# Patient Record
Sex: Male | Born: 1965 | Race: Black or African American | Hispanic: No | Marital: Married | Smoking: Never smoker
Health system: Southern US, Community
[De-identification: ages and names within clinical notes are randomized; demographics above are authoritative.]

## PROBLEM LIST (undated history)

## (undated) DIAGNOSIS — I10 Essential (primary) hypertension: Secondary | ICD-10-CM

## (undated) HISTORY — PX: FRACTURE SURGERY: SHX138

---

## 2019-07-18 ENCOUNTER — Ambulatory Visit
Admission: EM | Admit: 2019-07-18 | Discharge: 2019-07-18 | Disposition: A | Discharge status: Home / Self Care | Attending: Family Medicine | Admitting: Family Medicine

## 2019-07-18 ENCOUNTER — Encounter: Payer: Self-pay | Admitting: Emergency Medicine

## 2019-07-18 ENCOUNTER — Other Ambulatory Visit: Payer: Self-pay

## 2019-07-18 DIAGNOSIS — Z7189 Other specified counseling: Secondary | ICD-10-CM | POA: Diagnosis present

## 2019-07-18 DIAGNOSIS — U071 COVID-19: Secondary | ICD-10-CM | POA: Diagnosis not present

## 2019-07-18 DIAGNOSIS — Z20822 Contact with and (suspected) exposure to covid-19: Secondary | ICD-10-CM

## 2019-07-18 NOTE — ED Provider Notes (Signed)
[facility] ____________________________________________  Time seen: Approximately 12:36 PM  I have reviewed the triage vital signs and the nursing notes.   HISTORY  Chief Complaint COVID testing   HPI Scott [name] is a 54 y.o. male presenting for COVID-19 testing.  Patient reports he feels well, reports his wife is currently sick with COVID-19 as of last Wednesday.  Denies cough, congestion, sore throat, chest pain or shortness of breath, vomiting, diarrhea, change in taste or smell.  States feels well.  Denies aggravating or relieving factors.  History reviewed. No pertinent past medical history.  There are no problems to display for this patient.   History reviewed. No pertinent surgical history.   No current facility-administered medications for this encounter. No current outpatient medications on file.  Allergies Patient has no known allergies.  History reviewed. No pertinent family history.  Social History Social History   Tobacco Use  . Smoking status: Never Smoker  . Smokeless tobacco: Never Used  Substance Use Topics  . Alcohol use: Never  . Drug use: Never    Review of Systems Constitutional: No fever ENT: No sore throat. Cardiovascular: Denies chest pain. Respiratory: Denies shortness of breath. Gastrointestinal: No abdominal pain.  No nausea, no vomiting.  No diarrhea.  Musculoskeletal: Negative for back pain. Skin: Negative for rash.   ____________________________________________   PHYSICAL EXAM:  VITAL SIGNS: ED Triage Vitals  Enc Vitals Group     BP 07/18/19 1155 122/81     Pulse Rate 07/18/19 1155 78     Resp 07/18/19 1155 18     Temp 07/18/19 1155 100.2 F (37.9 C)     Temp Source 07/18/19 1155 Oral     SpO2 07/18/19 1155 100 %     Weight 07/18/19 1152 175 lb (79.4 kg)     Height 07/18/19 1152 5\' 8"  (1.727 m)     Head Circumference --      Peak Flow --      Pain Score 07/18/19 1152 0     Pain Loc --      Pain  Edu? --      Excl. in [place]? --     Constitutional: Alert and oriented. Well appearing and in no acute distress. Eyes: Conjunctivae are normal.  ENT      Head: Normocephalic and atraumatic. Cardiovascular: Normal rate, regular rhythm. Grossly normal heart sounds.  Good peripheral circulation. Respiratory: Normal respiratory effort without tachypnea nor retractions. Breath sounds are clear and equal bilaterally. No wheezes, rales, rhonchi. Musculoskeletal:  Steady gait.  Neurologic:  Normal speech and language. Speech is normal. No gait instability.  Skin:  Skin is warm, dry and intact. No rash noted. Psychiatric: Mood and affect are normal. Speech and behavior are normal. Patient exhibits appropriate insight and judgment   ___________________________________________   LABS (all labs ordered are listed, but only abnormal results are displayed)  Labs Reviewed  NOVEL CORONAVIRUS, NAA (HOSP ORDER, SEND-OUT TO REF LAB; TAT 18-24 HRS)   ____________________________________________   PROCEDURES Procedures    INITIAL IMPRESSION / ASSESSMENT AND PLAN / ED COURSE  Pertinent labs & imaging results that were available during my care of the patient were reviewed by me and considered in my medical decision making (see chart for details).  Well-appearing patient.  No acute distress.  COVID-19 testing completed and advice given.  Patient denies symptoms, however noted mild temperature elevation in urgent care, counseled to monitor.  Encouraged to remain home until testing result received and monitoring for symptoms.  Work note given.  Discussed follow up and return parameters including no resolution or any worsening concerns. Patient verbalized understanding and agreed to plan.   ____________________________________________   FINAL CLINICAL IMPRESSION(S) / ED DIAGNOSES  Final diagnoses:  Encounter for screening laboratory testing for COVID-19 virus  Advice given about COVID-19 virus  infection     ED Discharge Orders    None       Note: This dictation was prepared with Dragon dictation along with smaller phrase technology. Any transcriptional errors that result from this process are unintentional.         Renford Dills, NP 07/18/19 1238

## 2019-07-18 NOTE — ED Triage Notes (Signed)
Patient here for COVID testing. Exposed last week. No symptoms.

## 2019-07-18 NOTE — Discharge Instructions (Signed)
Monitor. Drink plenty of fluids.   Follow up with your primary care physician this week as needed. Return to Urgent care for new or worsening concerns.

## 2019-07-19 ENCOUNTER — Telehealth (HOSPITAL_COMMUNITY): Payer: Self-pay | Admitting: Emergency Medicine

## 2019-07-19 LAB — NOVEL CORONAVIRUS, NAA (HOSP ORDER, SEND-OUT TO REF LAB; TAT 18-24 HRS): SARS-CoV-2, NAA: DETECTED — AB

## 2019-07-19 NOTE — Telephone Encounter (Signed)

## 2019-10-05 ENCOUNTER — Encounter: Payer: Self-pay | Admitting: Emergency Medicine

## 2019-10-05 ENCOUNTER — Ambulatory Visit
Admission: EM | Admit: 2019-10-05 | Discharge: 2019-10-05 | Disposition: A | Discharge status: Home / Self Care | Attending: Family Medicine | Admitting: Family Medicine

## 2019-10-05 ENCOUNTER — Other Ambulatory Visit: Payer: Self-pay

## 2019-10-05 DIAGNOSIS — R001 Bradycardia, unspecified: Secondary | ICD-10-CM | POA: Diagnosis present

## 2019-10-05 DIAGNOSIS — M545 Low back pain: Secondary | ICD-10-CM | POA: Diagnosis present

## 2019-10-05 DIAGNOSIS — G8929 Other chronic pain: Secondary | ICD-10-CM | POA: Diagnosis present

## 2019-10-05 DIAGNOSIS — I440 Atrioventricular block, first degree: Secondary | ICD-10-CM

## 2019-10-05 LAB — BASIC METABOLIC PANEL
Anion gap: 8 (ref 5–15)
BUN: 20 mg/dL (ref 6–20)
CO2: 25 mmol/L (ref 22–32)
Calcium: 9.2 mg/dL (ref 8.9–10.3)
Chloride: 107 mmol/L (ref 98–111)
Creatinine, Ser: 0.93 mg/dL (ref 0.61–1.24)
GFR calc Af Amer: >60 mL/min (ref >60)
GFR calc non Af Amer: >60 mL/min (ref >60)
Glucose, Bld: 106 mg/dL — ABNORMAL HIGH (ref 70–99)
Potassium: 3.7 mmol/L (ref 3.5–5.1)
Sodium: 140 mmol/L (ref 135–145)

## 2019-10-05 LAB — MAGNESIUM: Magnesium: 2.2 mg/dL (ref 1.7–2.4)

## 2019-10-05 NOTE — Discharge Instructions (Addendum)
Establish care and follow up with Primary Care provider for further evaluation and possible referral

## 2019-10-05 NOTE — ED Triage Notes (Signed)
Patient in today c/o palpitations off & on x 2 weeks. Patient denies sob. Patient also c/o back pain that is a chronic issue for years. Patient use to get steroid injections in his back.

## 2019-10-06 LAB — TSH: TSH: 0.837 u[IU]/mL (ref 0.350–4.500)

## 2019-10-17 NOTE — ED Provider Notes (Addendum)
[facility]    CSN: [id number] Arrival date & time: 10/05/19  1725      History   Chief Complaint Chief Complaint  Patient presents with  . Palpitations  . Back Pain    HPI Scott [name] is a 54 y.o. male.   54 yo male with a c/o palpitations on and off for 2 weeks. States feels like heart "skipping a beat" and lasts "a few seconds". Denies any chest pains, shortness of breath, fevers, chills, cough, syncope, PND, orthopnea, leg swelling. States he exercises a couple times a week and does not have any problems with chest pains or shortness of breath.   Patient also c/o chronic low back pains intermittently for years. Usually controlled with otc medications. States unsure if this is related to the palpitations and that he has received steroid shots in his back in the past but none recently.    Palpitations Associated symptoms: back pain   Back Pain   History reviewed. No pertinent past medical history.  There are no problems to display for this patient.   Past Surgical History:  Procedure Laterality Date  . FRACTURE SURGERY Right    arm       Home Medications    Prior to Admission medications   Not on File    Family History Family History  Problem Relation Age of Onset  . Cervical cancer Mother   . Prostate cancer Father   . Hypertension Father     Social History Social History   Tobacco Use  . Smoking status: Never Smoker  . Smokeless tobacco: Never Used  Substance Use Topics  . Alcohol use: Never  . Drug use: Never     Allergies   Patient has no known allergies.   Review of Systems Review of Systems  Cardiovascular: Positive for palpitations.  Musculoskeletal: Positive for back pain.     Physical Exam Triage Vital Signs ED Triage Vitals  Enc Vitals Group     BP 10/05/19 1736 (!) 141/96     Pulse Rate 10/05/19 1736 62     Resp 10/05/19 1736 18     Temp 10/05/19 1736 98.2 F (36.8 C)     Temp Source 10/05/19 1736  Oral     SpO2 10/05/19 1736 99 %     Weight 10/05/19 1737 175 lb (79.4 kg)     Height 10/05/19 1737 5\' 8"  (1.727 m)     Head Circumference --      Peak Flow --      Pain Score 10/05/19 1736 7     Pain Loc --      Pain Edu? --      Excl. in [place]? --    No data found.  Updated Vital Signs BP (!) 141/96 (BP Location: Left Arm)   Pulse 62   Temp 98.2 F (36.8 C) (Oral)   Resp 18   Ht 5\' 8"  (1.727 m)   Wt 79.4 kg   SpO2 99%   BMI 26.61 kg/m   Visual Acuity Right Eye Distance:   Left Eye Distance:   Bilateral Distance:    Right Eye Near:   Left Eye Near:    Bilateral Near:     Physical Exam Vitals reviewed.  Constitutional:      General: He is not in acute distress.    Appearance: He is not toxic-appearing or diaphoretic.  Cardiovascular:     Rate and Rhythm: Normal rate and regular rhythm.  Heart sounds: Normal heart sounds.  Pulmonary:     Effort: Pulmonary effort is normal. No respiratory distress.     Breath sounds: Normal breath sounds. No stridor. No wheezing, rhonchi or rales.  Neurological:     Mental Status: He is alert.      UC Treatments / Results  Labs (all labs ordered are listed, but only abnormal results are displayed) Labs Reviewed  BASIC METABOLIC PANEL - Abnormal; Notable for the following components:      Result Value   Glucose, Bld 106 (*)    All other components within normal limits  MAGNESIUM  TSH    EKG   Radiology No results found.  Procedures ED EKG  Date/Time: 10/17/2019 2:07 PM Performed by: Payton Mccallum, MD Authorized by: Payton Mccallum, MD   ECG reviewed by ED Physician in the absence of a cardiologist: yes   Previous ECG:    Previous ECG:  Unavailable Interpretation:    Interpretation: abnormal   Rate:    ECG rate:  59   ECG rate assessment: bradycardic   Rhythm:    Rhythm: sinus bradycardia   Ectopy:    Ectopy: aberrant   QRS:    QRS axis:  Normal   QRS intervals:  Normal Conduction:    Conduction:  abnormal     Abnormal conduction: 1st degree   ST segments:    ST segments:  Normal T waves:    T waves: normal     (including critical care time)  Medications Ordered in UC Medications - No data to display  Initial Impression / Assessment and Plan / UC Course  I have reviewed the triage vital signs and the nursing notes.  Pertinent labs & imaging results that were available during my care of the patient were reviewed by me and considered in my medical decision making (see chart for details).      Final Clinical Impressions(s) / UC Diagnoses   Final diagnoses:  Sinus bradycardia  1st degree AV block  Chronic left-sided low back pain without sciatica     Discharge Instructions     Establish care and follow up with Primary Care provider for further evaluation and possible referral     ED Prescriptions    None      1. ekg, lab results and diagnosis reviewed with patient 2. Recommend patient follow up with PCP for further evaluation (ie. Possible Holter monitor and/or referral to cardiologist) 3. TSH pending 4. Follow-up prn if symptoms worsen or don't improve   PDMP not reviewed this encounter.   Payton Mccallum, MD 10/17/19 1405    Payton Mccallum, MD 10/17/19 1407

## 2019-10-19 ENCOUNTER — Telehealth: Payer: Self-pay

## 2019-10-19 NOTE — Telephone Encounter (Signed)
Called lmom informing patient of appointment on 10/23/2019. klh 

## 2019-10-23 ENCOUNTER — Other Ambulatory Visit: Payer: Self-pay

## 2019-10-23 ENCOUNTER — Encounter: Payer: Self-pay | Admitting: Adult Health

## 2019-10-23 ENCOUNTER — Ambulatory Visit (INDEPENDENT_AMBULATORY_CARE_PROVIDER_SITE_OTHER): Admitting: Adult Health

## 2019-10-23 ENCOUNTER — Ambulatory Visit
Admission: RE | Admit: 2019-10-23 | Discharge: 2019-10-23 | Disposition: A | Discharge status: Home / Self Care | Attending: Adult Health | Admitting: Adult Health

## 2019-10-23 ENCOUNTER — Ambulatory Visit
Admission: RE | Admit: 2019-10-23 | Discharge: 2019-10-23 | Disposition: A | Discharge status: Home / Self Care | Source: Ambulatory Visit | Attending: Adult Health | Admitting: Adult Health

## 2019-10-23 VITALS — BP 152/87 | HR 60 | Temp 97.4°F | Resp 16 | Ht 68.0 in | Wt 182.4 lb

## 2019-10-23 DIAGNOSIS — Z8616 Personal history of COVID-19: Secondary | ICD-10-CM

## 2019-10-23 DIAGNOSIS — R400 Somnolence: Secondary | ICD-10-CM | POA: Diagnosis not present

## 2019-10-23 DIAGNOSIS — M5126 Other intervertebral disc displacement, lumbar region: Secondary | ICD-10-CM | POA: Diagnosis not present

## 2019-10-23 DIAGNOSIS — R05 Cough: Secondary | ICD-10-CM | POA: Diagnosis not present

## 2019-10-23 DIAGNOSIS — R059 Cough, unspecified: Secondary | ICD-10-CM

## 2019-10-23 DIAGNOSIS — R0683 Snoring: Secondary | ICD-10-CM | POA: Diagnosis not present

## 2019-10-23 NOTE — Progress Notes (Signed)
[name] [address] [place], [place] [address]  Internal MEDICINE  Office Visit Note  Patient Name: Scott [name]  [id number]  [id number]  Date of Service: 10/31/2019   Complaints/HPI Pt is here for establishment of PCP. Chief Complaint  Patient presents with  . New Patient (Initial Visit)   HPI  Pt is here to establish care.  He is a well appearing 54 yo male.  He has a history of lower back herniated disc, with left leg pain. He is requesting an ortho referral at this time.  He recently had an episode of heart palpitations and was seen in urgent care.  They were unable to locate a cause. He also Had inconclusive sleep study (fall 2020) and they recommended he see a specialist. Covid outbreak happened and he never followed up. He also complains of ongoing cough.  He did have Covid-19, and has not been able to stop coughing.  It is intermittent.      Current Medication: No outpatient encounter medications on file as of 10/23/2019.   No facility-administered encounter medications on file as of 10/23/2019.    Surgical History: Past Surgical History:  Procedure Laterality Date  . FRACTURE SURGERY Right    arm    Medical History: History reviewed. No pertinent past medical history.  Family History: Family History  Problem Relation Age of Onset  . Cervical cancer Mother   . Prostate cancer Father   . Hypertension Father     Social History   Socioeconomic History  . Marital status: Married    Spouse name: Not on file  . Number of children: Not on file  . Years of education: Not on file  . Highest education level: Not on file  Occupational History  . Not on file  Tobacco Use  . Smoking status: Never Smoker  . Smokeless tobacco: Never Used  Substance and Sexual Activity  . Alcohol use: Never  . Drug use: Never  . Sexual activity: Not on file  Other Topics Concern  . Not on file  Social History Narrative  . Not on file   Social Determinants of  Health   Financial Resource Strain:   . Difficulty of Paying Living Expenses:   Food Insecurity:   . Worried About Programme researcher, broadcasting/film/video in the Last Year:   . Barista in the Last Year:   Transportation Needs:   . Freight forwarder (Medical):   Marland Kitchen Lack of Transportation (Non-Medical):   Physical Activity:   . Days of Exercise per Week:   . Minutes of Exercise per Session:   Stress:   . Feeling of Stress :   Social Connections:   . Frequency of Communication with Friends and Family:   . Frequency of Social Gatherings with Friends and Family:   . Attends Religious Services:   . Active Member of Clubs or Organizations:   . Attends Banker Meetings:   Marland Kitchen Marital Status:   Intimate Partner Violence:   . Fear of Current or Ex-Partner:   . Emotionally Abused:   Marland Kitchen Physically Abused:   . Sexually Abused:      Review of Systems  Constitutional: Negative.  Negative for chills, fatigue and unexpected weight change.  HENT: Negative.  Negative for congestion, rhinorrhea, sneezing and sore throat.   Eyes: Negative for redness.  Respiratory: Negative.  Negative for cough, chest tightness and shortness of breath.   Cardiovascular: Negative.  Negative for chest pain and palpitations.  Gastrointestinal: Negative.  Negative for abdominal pain, constipation, diarrhea, nausea and vomiting.  Endocrine: Negative.   Genitourinary: Negative.  Negative for dysuria and frequency.  Musculoskeletal: Negative.  Negative for arthralgias, back pain, joint swelling and neck pain.  Skin: Negative.  Negative for rash.  Allergic/Immunologic: Negative.   Neurological: Negative.  Negative for tremors and numbness.  Hematological: Negative for adenopathy. Does not bruise/bleed easily.  Psychiatric/Behavioral: Negative.  Negative for behavioral problems, sleep disturbance and suicidal ideas. The patient is not nervous/anxious.     Vital Signs: BP (!) 152/87   Pulse 60   Temp (!) 97.4 F  (36.3 C)   Resp 16   Ht 5\' 8"  (1.727 m)   Wt 182 lb 6.4 oz (82.7 kg)   SpO2 96%   BMI 27.73 kg/m    Physical Exam Vitals and nursing note reviewed.  Constitutional:      General: He is not in acute distress.    Appearance: He is well-developed. He is not diaphoretic.  HENT:     Head: Normocephalic and atraumatic.     Mouth/Throat:     Pharynx: No oropharyngeal exudate.  Eyes:     Pupils: Pupils are equal, round, and reactive to light.  Neck:     Thyroid: No thyromegaly.     Vascular: No JVD.     Trachea: No tracheal deviation.  Cardiovascular:     Rate and Rhythm: Normal rate and regular rhythm.     Heart sounds: Normal heart sounds. No murmur. No friction rub. No gallop.   Pulmonary:     Effort: Pulmonary effort is normal. No respiratory distress.     Breath sounds: Normal breath sounds. No wheezing or rales.  Chest:     Chest wall: No tenderness.  Abdominal:     Palpations: Abdomen is soft.     Tenderness: There is no abdominal tenderness. There is no guarding.  Musculoskeletal:        General: Normal range of motion.     Cervical back: Normal range of motion and neck supple.  Lymphadenopathy:     Cervical: No cervical adenopathy.  Skin:    General: Skin is warm and dry.  Neurological:     Mental Status: He is alert and oriented to person, place, and time.     Cranial Nerves: No cranial nerve deficit.  Psychiatric:        Behavior: Behavior normal.        Thought Content: Thought content normal.        Judgment: Judgment normal.    Assessment/Plan: 1. Herniated lumbar disc without myelopathy Referral per patient request.  - Ambulatory referral to Orthopedic Surgery  2. Cough Have PFT and CXR done before next visit.  - Pulmonary Function Test; Future - DG Chest 2 View; Future  3. Loud snoring Discussed repeating sleep study, he would like to get his back taken care of first.   4. Daytime sleepiness Needs sleep study, see above.   5. History of  COVID-19 Recovered, lingering cough.   General Counseling: hershell [name] understanding of the findings of todays visit and agrees with plan of treatment. I have discussed any further diagnostic evaluation that may be needed or ordered today. We also reviewed his medications today. he has been encouraged to call the office with any questions or concerns that should arise related to todays visit.  Orders Placed This Encounter  Procedures  . DG Chest 2 View  . Ambulatory referral to Orthopedic Surgery  .  Pulmonary Function Test    No orders of the defined types were placed in this encounter.   Time spent: 30 Minutes   This patient was seen by Blima Ledger AGNP-C in Collaboration with Dr Lyndon Code as a part of collaborative care agreement  Johnna Acosta AGNP-C Internal Medicine

## 2019-11-05 ENCOUNTER — Telehealth: Payer: Self-pay

## 2019-11-05 NOTE — Telephone Encounter (Signed)
Confirmed appointment on 11/07/2019 and screened for covid. klh °

## 2019-11-07 ENCOUNTER — Other Ambulatory Visit: Payer: Self-pay

## 2019-11-07 ENCOUNTER — Ambulatory Visit (INDEPENDENT_AMBULATORY_CARE_PROVIDER_SITE_OTHER): Admitting: Internal Medicine

## 2019-11-07 DIAGNOSIS — R0602 Shortness of breath: Secondary | ICD-10-CM

## 2019-11-07 LAB — PULMONARY FUNCTION TEST

## 2019-11-09 ENCOUNTER — Other Ambulatory Visit: Payer: Self-pay | Admitting: Orthopedic Surgery

## 2019-11-09 DIAGNOSIS — Z8739 Personal history of other diseases of the musculoskeletal system and connective tissue: Secondary | ICD-10-CM

## 2019-11-09 DIAGNOSIS — M5442 Lumbago with sciatica, left side: Secondary | ICD-10-CM

## 2019-11-09 DIAGNOSIS — M545 Low back pain, unspecified: Secondary | ICD-10-CM

## 2019-11-15 NOTE — Procedures (Signed)
[facility] [address] [place], [address]  DATE OF SERVICE: Nov 07, 2019  Complete Pulmonary Function Testing Interpretation:  FINDINGS:  Forced vital capacity is normal.  The FEV1 is normal.  Postbronchodilator no significant change in FEV1.  FEV1 FVC ratio is normal.  Total lung capacity is decreased mildly decreased.  Residual volume is decreased residual in total lung capacity ratio is decreased FRC is decreased.  DLCO is mildly decreased.  DLCO corrected for alveolar volume is normal.  IMPRESSION:  This pulmonary function study is suggestive of mild restrictive lung disease.  There is no response to bronchodilators clinical correlation recommended.  Yevonne Pax, MD [facility] Pulmonary Critical Care Medicine Sleep Medicine

## 2019-11-19 ENCOUNTER — Telehealth: Payer: Self-pay

## 2019-11-19 NOTE — Telephone Encounter (Signed)
Confirmed appointment on 11/21/2019 and screened for covid. klh 

## 2019-11-21 ENCOUNTER — Ambulatory Visit: Admission: RE | Admit: 2019-11-21 | Source: Ambulatory Visit

## 2019-11-21 ENCOUNTER — Encounter: Payer: Self-pay | Admitting: Adult Health

## 2019-11-21 ENCOUNTER — Other Ambulatory Visit: Payer: Self-pay

## 2019-11-21 ENCOUNTER — Ambulatory Visit (INDEPENDENT_AMBULATORY_CARE_PROVIDER_SITE_OTHER): Admitting: Adult Health

## 2019-11-21 VITALS — BP 149/96 | HR 59 | Temp 97.4°F | Resp 16 | Ht 68.0 in | Wt 179.8 lb

## 2019-11-21 DIAGNOSIS — R61 Generalized hyperhidrosis: Secondary | ICD-10-CM | POA: Diagnosis not present

## 2019-11-21 DIAGNOSIS — Z0001 Encounter for general adult medical examination with abnormal findings: Secondary | ICD-10-CM | POA: Diagnosis not present

## 2019-11-21 DIAGNOSIS — R300 Dysuria: Secondary | ICD-10-CM

## 2019-11-21 DIAGNOSIS — R059 Cough, unspecified: Secondary | ICD-10-CM

## 2019-11-21 DIAGNOSIS — Z125 Encounter for screening for malignant neoplasm of prostate: Secondary | ICD-10-CM

## 2019-11-21 DIAGNOSIS — F4310 Post-traumatic stress disorder, unspecified: Secondary | ICD-10-CM

## 2019-11-21 DIAGNOSIS — N529 Male erectile dysfunction, unspecified: Secondary | ICD-10-CM

## 2019-11-21 DIAGNOSIS — R05 Cough: Secondary | ICD-10-CM

## 2019-11-21 DIAGNOSIS — R0683 Snoring: Secondary | ICD-10-CM

## 2019-11-21 MED ORDER — SILDENAFIL CITRATE 20 MG PO TABS: 20.0000 mg | ORAL_TABLET | Freq: Every day | ORAL | 0 refills | Status: DC | PRN

## 2019-11-21 NOTE — Progress Notes (Signed)
[facility] [place], [place] [address]  Internal MEDICINE  Office Visit Note  Patient Name: Scott [name]  [id number]  [id number]  Date of Service: 12/08/2019  Chief Complaint  Patient presents with  . Annual Exam    review pft  . Back Pain    lower left side of back  . Cough    on and off cough since jan.     HPI Pt is here for routine health maintenance examination and to review pft. He is a well appearing 54 yo AA male.  He has a history of PTSD and ED.  He currently takes Sildenafil as his only RX medication.  He denies ever being on other medications. He considers his PTSD mild, he described nightmares about twice a week.  He does not see a counselor, and after discussed he would like to do so at this time.  He also reports some night sweats that have become more frequently.  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  He denies any hemoptysis but does report some coughing. He also reports his wife is complaining about his loud snoring, he does cough some at night when waking up.        Current Medication: Outpatient Encounter Medications as of 11/21/2019  Medication Sig  . sildenafil (REVATIO) 20 MG tablet Take 1 tablet (20 mg total) by mouth daily as needed.   No facility-administered encounter medications on file as of 11/21/2019.    Surgical History: Past Surgical History:  Procedure Laterality Date  . FRACTURE SURGERY Right    arm    Medical History: History reviewed. No pertinent past medical history.  Family History: Family History  Problem Relation Age of Onset  . Cervical cancer Mother   . Prostate cancer Father   . Hypertension Father       Review of Systems  Constitutional: Negative.  Negative for chills, fatigue and unexpected weight change.  HENT: Negative.  Negative for congestion, rhinorrhea, sneezing and sore throat.   Eyes: Negative for redness.   Respiratory: Positive for cough. Negative for chest tightness and shortness of breath.   Cardiovascular: Negative.  Negative for chest pain and palpitations.  Gastrointestinal: Negative.  Negative for abdominal pain, constipation, diarrhea, nausea and vomiting.  Endocrine: Negative.   Genitourinary: Negative.  Negative for dysuria and frequency.  Musculoskeletal: Negative.  Negative for arthralgias, back pain, joint swelling and neck pain.  Skin: Negative.  Negative for rash.       Night sweats  Allergic/Immunologic: Negative.   Neurological: Negative.  Negative for tremors and numbness.  Hematological: Negative for adenopathy. Does not bruise/bleed easily.  Psychiatric/Behavioral: Negative.  Negative for behavioral problems, sleep disturbance and suicidal ideas. The patient is not nervous/anxious.      Vital Signs: BP (!) 149/96   Pulse (!) 59   Temp (!) 97.4 F (36.3 C)   Resp 16   Ht 5\' 8"  (1.727 m)   Wt 179 lb 12.8 oz (81.6 kg)   SpO2 95%   BMI 27.34 kg/m    Physical Exam Vitals and nursing note reviewed.  Constitutional:      General: He is not in acute distress.    Appearance: He is well-developed. He is not diaphoretic.  HENT:     Head: Normocephalic and atraumatic.     Mouth/Throat:     Pharynx: No oropharyngeal exudate.  Eyes:     Pupils: Pupils  are equal, round, and reactive to light.  Neck:     Thyroid: No thyromegaly.     Vascular: No JVD.     Trachea: No tracheal deviation.  Cardiovascular:     Rate and Rhythm: Normal rate and regular rhythm.     Heart sounds: Normal heart sounds. No murmur heard.  No friction rub. No gallop.   Pulmonary:     Effort: Pulmonary effort is normal. No respiratory distress.     Breath sounds: Normal breath sounds. No wheezing or rales.  Chest:     Chest wall: No tenderness.  Abdominal:     Palpations: Abdomen is soft.  Musculoskeletal:        General: Normal range of motion.     Cervical back: Normal range of motion  and neck supple.  Lymphadenopathy:     Cervical: No cervical adenopathy.  Skin:    General: Skin is warm and dry.  Neurological:     Mental Status: He is alert and oriented to person, place, and time.     Cranial Nerves: No cranial nerve deficit.  Psychiatric:        Behavior: Behavior normal.        Thought Content: Thought content normal.        Judgment: Judgment normal.      LABS: Recent Results (from the past 2160 hour(s))  Basic metabolic panel     Status: Abnormal   Collection Time: 10/05/19  6:08 PM  Result Value Ref Range   Sodium 140 135 - 145 mmol/L   Potassium 3.7 3.5 - 5.1 mmol/L   Chloride 107 98 - 111 mmol/L   CO2 25 22 - 32 mmol/L   Glucose, Bld 106 (H) 70 - 99 mg/dL    Comment: Glucose reference range applies only to samples taken after fasting for at least 8 hours.   BUN 20 6 - 20 mg/dL   Creatinine, Ser 8.29 0.61 - 1.24 mg/dL   Calcium 9.2 8.9 - 56.2 mg/dL   GFR calc non Af Amer >60 >60 mL/min   GFR calc Af Amer >60 >60 mL/min   Anion gap 8 5 - 15    Comment: Performed at [facility], [address] [place], [place] [address]  Magnesium     Status: None   Collection Time: 10/05/19  6:08 PM  Result Value Ref Range   Magnesium 2.2 1.7 - 2.4 mg/dL    Comment: Performed at [facility], [address], [place], [place] [address]  TSH     Status: None   Collection Time: 10/05/19  6:08 PM  Result Value Ref Range   TSH 0.837 0.350 - 4.500 uIU/mL    Comment: Performed by a 3rd Generation assay with a functional sensitivity of <=0.01 uIU/mL. Performed at [facility], [address], [place], [place] [address]   Pulmonary Function Test     Status: None   Collection Time: 11/07/19 11:30 AM  Result Value Ref Range   FEV1     FVC     FEV1/FVC     TLC     DLCO    Urinalysis, Routine w reflex microscopic     Status: None   Collection Time: 11/21/19 11:52 AM  Result Value Ref Range   Specific Gravity, UA 1.018 1.005 -  1.030   pH, UA 7.5 5.0 - 7.5   Color, UA Yellow Yellow   Appearance Ur Clear Clear   Leukocytes,UA Negative Negative  Protein,UA Negative Negative/Trace   Glucose, UA Negative Negative   Ketones, UA Negative Negative   RBC, UA Negative Negative   Bilirubin, UA Negative Negative   Urobilinogen, Ur 1.0 0.2 - 1.0 mg/dL   Nitrite, UA Negative Negative   Microscopic Examination Comment     Comment: Microscopic not indicated and not performed.    Assessment/Plan: 1. Encounter for general adult medical examination with abnormal findings Up to date on pHM - CBC with Differential/Platelet - Lipid Panel With LDL/HDL Ratio - TSH - T4, free - Comprehensive metabolic panel  2. Cough Have CT chest - QuantiFERON-TB Gold Plus - CT Chest W Contrast; Future  3. Night sweats - QuantiFERON-TB Gold Plus  4. 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  Dispense: 10 tablet; Refill: 0  6. PTSD (post-traumatic stress disorder) Referral to psych for counseling.   7. Loud snoring Discussed sleep study, patient would like to have other testing done, and then talk about sleep study at follow up appointments.   8. Dysuria - Urinalysis, Routine w reflex microscopic  General Counseling: noah lembke understanding of the findings of todays visit and agrees with plan of treatment. I have discussed any further diagnostic evaluation that may be needed or ordered today. We also reviewed his medications today. he has been encouraged to call the office with any questions or concerns that should arise related to todays visit.   Orders Placed This Encounter  Procedures  . CT Chest W Contrast  . QuantiFERON-TB Gold Plus  . CBC with Differential/Platelet  . Lipid Panel With LDL/HDL Ratio   . TSH  . T4, free  . Comprehensive metabolic panel  . PSA  . Testosterone,Free and Total  . Prolactin  . Urinalysis, Routine w reflex microscopic    Meds ordered this encounter  Medications  . sildenafil (REVATIO) 20 MG tablet    Sig: Take 1 tablet (20 mg total) by mouth daily as needed.    Dispense:  10 tablet    Refill:  0    Time spent: 35 Minutes   This patient was seen by Blima Ledger AGNP-C in Collaboration with Dr Lyndon Code as a part of collaborative care agreement    Johnna Acosta AGNP-C Internal Medicine

## 2019-11-22 LAB — URINALYSIS, ROUTINE W REFLEX MICROSCOPIC
Bilirubin, UA: NEGATIVE
Glucose, UA: NEGATIVE
Ketones, UA: NEGATIVE
Leukocytes,UA: NEGATIVE
Nitrite, UA: NEGATIVE
Protein,UA: NEGATIVE
RBC, UA: NEGATIVE
Specific Gravity, UA: 1.018 (ref 1.005–1.030)
Urobilinogen, Ur: 1 mg/dL (ref 0.2–1.0)
pH, UA: 7.5 (ref 5.0–7.5)

## 2019-11-23 ENCOUNTER — Other Ambulatory Visit: Payer: Self-pay

## 2019-11-23 ENCOUNTER — Other Ambulatory Visit: Payer: Self-pay | Admitting: Adult Health

## 2019-11-23 ENCOUNTER — Ambulatory Visit
Admission: RE | Admit: 2019-11-23 | Discharge: 2019-11-23 | Disposition: A | Discharge status: Home / Self Care | Source: Ambulatory Visit | Attending: Adult Health | Admitting: Adult Health

## 2019-11-23 DIAGNOSIS — R059 Cough, unspecified: Secondary | ICD-10-CM

## 2019-11-23 DIAGNOSIS — R05 Cough: Secondary | ICD-10-CM | POA: Insufficient documentation

## 2019-11-28 ENCOUNTER — Other Ambulatory Visit: Payer: Self-pay

## 2019-11-28 ENCOUNTER — Ambulatory Visit
Admission: RE | Admit: 2019-11-28 | Discharge: 2019-11-28 | Disposition: A | Discharge status: Home / Self Care | Source: Ambulatory Visit | Attending: Orthopedic Surgery | Admitting: Orthopedic Surgery

## 2019-11-28 DIAGNOSIS — M545 Low back pain, unspecified: Secondary | ICD-10-CM

## 2019-11-28 DIAGNOSIS — M5442 Lumbago with sciatica, left side: Secondary | ICD-10-CM | POA: Diagnosis not present

## 2019-11-28 DIAGNOSIS — G8929 Other chronic pain: Secondary | ICD-10-CM | POA: Insufficient documentation

## 2019-11-28 DIAGNOSIS — Z8739 Personal history of other diseases of the musculoskeletal system and connective tissue: Secondary | ICD-10-CM | POA: Insufficient documentation

## 2019-12-03 ENCOUNTER — Ambulatory Visit
Admission: RE | Admit: 2019-12-03 | Discharge: 2019-12-03 | Disposition: A | Discharge status: Home / Self Care | Source: Ambulatory Visit | Attending: Adult Health | Admitting: Adult Health

## 2019-12-03 ENCOUNTER — Other Ambulatory Visit: Payer: Self-pay

## 2019-12-03 DIAGNOSIS — R059 Cough, unspecified: Secondary | ICD-10-CM

## 2019-12-03 DIAGNOSIS — R05 Cough: Secondary | ICD-10-CM | POA: Insufficient documentation

## 2019-12-03 MED ORDER — IOHEXOL 300 MG/ML  SOLN
75.0000 mL | Freq: Once | INTRAMUSCULAR | Status: AC | PRN
  Administered 2019-12-03: 75 mL via INTRAVENOUS

## 2019-12-05 ENCOUNTER — Encounter: Payer: Self-pay | Admitting: Adult Health

## 2019-12-05 ENCOUNTER — Other Ambulatory Visit: Payer: Self-pay

## 2019-12-05 ENCOUNTER — Ambulatory Visit (INDEPENDENT_AMBULATORY_CARE_PROVIDER_SITE_OTHER): Admitting: Adult Health

## 2019-12-05 VITALS — BP 154/94 | HR 58 | Temp 97.2°F | Resp 16 | Ht 68.0 in | Wt 178.6 lb

## 2019-12-05 DIAGNOSIS — I716 Thoracoabdominal aortic aneurysm, without rupture, unspecified: Secondary | ICD-10-CM

## 2019-12-05 DIAGNOSIS — I10 Essential (primary) hypertension: Secondary | ICD-10-CM | POA: Diagnosis not present

## 2019-12-05 DIAGNOSIS — K3189 Other diseases of stomach and duodenum: Secondary | ICD-10-CM | POA: Diagnosis not present

## 2019-12-05 MED ORDER — AMLODIPINE BESYLATE 2.5 MG PO TABS: 2.5000 mg | ORAL_TABLET | Freq: Every day | ORAL | 2 refills | Status: DC

## 2019-12-05 NOTE — Progress Notes (Signed)
[facility] [place],  [address]  Internal MEDICINE  Office Visit Note  Patient Name: Scott [name]  [id number]  [id number]  Date of Service: 12/05/2019  Chief Complaint  Patient presents with  . Follow-up    review ct and ultrasound     HPI  PT is here for follow up on Ct and Upper GI.     Current Medication: Outpatient Encounter Medications as of 12/05/2019  Medication Sig  . sildenafil (REVATIO) 20 MG tablet Take 1 tablet (20 mg total) by mouth daily as needed.  Marland Kitchen amLODipine (NORVASC) 2.5 MG tablet Take 1 tablet (2.5 mg total) by mouth daily.   No facility-administered encounter medications on file as of 12/05/2019.    Surgical History: Past Surgical History:  Procedure Laterality Date  . FRACTURE SURGERY Right    arm    Medical History: History reviewed. No pertinent past medical history.  Family History: Family History  Problem Relation Age of Onset  . Cervical cancer Mother   . Prostate cancer Father   . Hypertension Father     Social History   Socioeconomic History  . Marital status: Married    Spouse name: Not on file  . Number of children: Not on file  . Years of education: Not on file  . Highest education level: Not on file  Occupational History  . Not on file  Tobacco Use  . Smoking status: Never Smoker  . Smokeless tobacco: Never Used  Substance and Sexual Activity  . Alcohol use: Never  . Drug use: Never  . Sexual activity: Not on file  Other Topics Concern  . Not on file  Social History Narrative  . Not on file   Social Determinants of Health   Financial Resource Strain:   . Difficulty of Paying Living Expenses:   Food Insecurity:   . Worried About Charity fundraiser in the Last Year:   . Arboriculturist in the Last Year:   Transportation Needs:   . Film/video editor (Medical):   Marland Kitchen Lack of Transportation (Non-Medical):   Physical Activity:   . Days of Exercise per Week:   . Minutes of  Exercise per Session:   Stress:   . Feeling of Stress :   Social Connections:   . Frequency of Communication with Friends and Family:   . Frequency of Social Gatherings with Friends and Family:   . Attends Religious Services:   . Active Member of Clubs or Organizations:   . Attends Archivist Meetings:   Marland Kitchen Marital Status:   Intimate Partner Violence:   . Fear of Current or Ex-Partner:   . Emotionally Abused:   Marland Kitchen Physically Abused:   . Sexually Abused:       Review of Systems  Constitutional: Negative.  Negative for chills, fatigue and unexpected weight change.  HENT: Negative.  Negative for congestion, rhinorrhea, sneezing and sore throat.   Eyes: Negative for redness.  Respiratory: Negative.  Negative for cough, chest tightness and shortness of breath.   Cardiovascular: Negative.  Negative for chest pain and palpitations.  Gastrointestinal: Negative.  Negative for abdominal pain, constipation, diarrhea, nausea and vomiting.  Endocrine: Negative.   Genitourinary: Negative.  Negative for dysuria and frequency.  Musculoskeletal: Negative.  Negative for arthralgias, back pain, joint swelling and neck pain.  Skin: Negative.  Negative for rash.  Allergic/Immunologic: Negative.   Neurological: Negative.  Negative for tremors and numbness.  Hematological: Negative for adenopathy.  Does not bruise/bleed easily.  Psychiatric/Behavioral: Negative.  Negative for behavioral problems, sleep disturbance and suicidal ideas. The patient is not nervous/anxious.     Vital Signs: BP (!) 154/94   Pulse (!) 58   Temp (!) 97.2 F (36.2 C)   Resp 16   Ht 5\' 8"  (1.727 m)   Wt 178 lb 9.6 oz (81 kg)   SpO2 99%   BMI 27.16 kg/m    Physical Exam Vitals and nursing note reviewed.  Constitutional:      General: He is not in acute distress.    Appearance: He is well-developed. He is not diaphoretic.  HENT:     Head: Normocephalic and atraumatic.     Mouth/Throat:     Pharynx: No  oropharyngeal exudate.  Eyes:     Pupils: Pupils are equal, round, and reactive to light.  Neck:     Thyroid: No thyromegaly.     Vascular: No JVD.     Trachea: No tracheal deviation.  Cardiovascular:     Rate and Rhythm: Normal rate and regular rhythm.     Heart sounds: Normal heart sounds. No murmur heard.  No friction rub. No gallop.   Pulmonary:     Effort: Pulmonary effort is normal. No respiratory distress.     Breath sounds: Normal breath sounds. No wheezing or rales.  Chest:     Chest wall: No tenderness.  Abdominal:     Palpations: Abdomen is soft.  Musculoskeletal:        General: Normal range of motion.     Cervical back: Normal range of motion and neck supple.  Lymphadenopathy:     Cervical: No cervical adenopathy.  Skin:    General: Skin is warm and dry.  Neurological:     Mental Status: He is alert and oriented to person, place, and time.     Cranial Nerves: No cranial nerve deficit.  Psychiatric:        Behavior: Behavior normal.        Thought Content: Thought content normal.        Judgment: Judgment normal.    Assessment/Plan: 1. Submucosal lesion of stomach Lesion seen on CT, will send to GI for follow up - Ambulatory referral to Gastroenterology  2. 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  - CT ANGIO CHEST AORTA W/CM & OR WO/CM; Future  3. Essential hypertension Start amlodipine to reduce BP.   General Counseling: terry bolotin understanding of the findings of todays visit and agrees with plan of treatment. I have discussed any further diagnostic evaluation that may be needed or ordered today. We also reviewed his medications today. he has been encouraged to call the office with any questions or concerns that should arise related to todays visit.    No orders of the defined types were placed in this encounter.   Meds ordered this encounter  Medications  . amLODipine (NORVASC) 2.5  MG tablet    Sig: Take 1 tablet (2.5 mg total) by mouth daily.    Dispense:  30 tablet    Refill:  2    Time spent:25 Minutes   This patient was seen by Marion Downer AGNP-C in Collaboration with Dr Blima Ledger as a part of collaborative care agreement     Lyndon Code AGNP-C Internal medicine

## 2019-12-10 ENCOUNTER — Other Ambulatory Visit: Payer: Self-pay

## 2019-12-10 DIAGNOSIS — I10 Essential (primary) hypertension: Secondary | ICD-10-CM

## 2019-12-10 MED ORDER — AMLODIPINE BESYLATE 2.5 MG PO TABS: 2.5000 mg | ORAL_TABLET | Freq: Every day | ORAL | 2 refills | Status: DC

## 2019-12-18 ENCOUNTER — Ambulatory Visit: Admission: RE | Admit: 2019-12-18 | Source: Ambulatory Visit

## 2019-12-19 DIAGNOSIS — G8929 Other chronic pain: Secondary | ICD-10-CM | POA: Insufficient documentation

## 2019-12-19 DIAGNOSIS — M5116 Intervertebral disc disorders with radiculopathy, lumbar region: Secondary | ICD-10-CM | POA: Insufficient documentation

## 2019-12-19 DIAGNOSIS — M48061 Spinal stenosis, lumbar region without neurogenic claudication: Secondary | ICD-10-CM | POA: Insufficient documentation

## 2019-12-24 ENCOUNTER — Telehealth: Payer: Self-pay

## 2019-12-24 NOTE — Telephone Encounter (Signed)
Called lmom informing patient of appointment on 12/26/2019. klh

## 2019-12-26 ENCOUNTER — Ambulatory Visit (INDEPENDENT_AMBULATORY_CARE_PROVIDER_SITE_OTHER): Admitting: Adult Health

## 2019-12-26 ENCOUNTER — Other Ambulatory Visit: Payer: Self-pay

## 2019-12-26 ENCOUNTER — Encounter: Payer: Self-pay | Admitting: Adult Health

## 2019-12-26 VITALS — BP 144/93 | HR 59 | Temp 97.5°F | Resp 16 | Ht 68.0 in | Wt 176.2 lb

## 2019-12-26 DIAGNOSIS — I10 Essential (primary) hypertension: Secondary | ICD-10-CM | POA: Diagnosis not present

## 2019-12-26 DIAGNOSIS — K3189 Other diseases of stomach and duodenum: Secondary | ICD-10-CM

## 2019-12-26 DIAGNOSIS — I716 Thoracoabdominal aortic aneurysm, without rupture, unspecified: Secondary | ICD-10-CM

## 2019-12-26 MED ORDER — AMLODIPINE BESYLATE 10 MG PO TABS: 10.0000 mg | ORAL_TABLET | Freq: Every day | ORAL | 0 refills | Status: DC

## 2019-12-26 NOTE — Progress Notes (Signed)
[facility] [address] [place], [place] [address]  Internal MEDICINE  Office Visit Note  Patient Name: Scott [name]  [id number]  [id number]  Date of Service: 12/26/2019  Chief Complaint  Patient presents with  . Follow-up  . Back Pain    lower back    HPI  Pt is here for follow up on HTN. He reports his blood pressure has remained elevated at home.  It was good in another doctors office yesterday.  He thinks he may need to buy a new BP cuff for home.  He has been taking 5mg  of amlodipine nightly.  He continues to have low back pain, and has an appt on Friday for a steroid injection.     Current Medication: Outpatient Encounter Medications as of 12/26/2019  Medication Sig  . amLODipine (NORVASC) 2.5 MG tablet Take 1 tablet (2.5 mg total) by mouth daily.  . sildenafil (REVATIO) 20 MG tablet Take 1 tablet (20 mg total) by mouth daily as needed.   No facility-administered encounter medications on file as of 12/26/2019.    Surgical History: Past Surgical History:  Procedure Laterality Date  . FRACTURE SURGERY Right    arm    Medical History: History reviewed. No pertinent past medical history.  Family History: Family History  Problem Relation Age of Onset  . Cervical cancer Mother   . Prostate cancer Father   . Hypertension Father     Social History   Socioeconomic History  . Marital status: Married    Spouse name: Not on file  . Number of children: Not on file  . Years of education: Not on file  . Highest education level: Not on file  Occupational History  . Not on file  Tobacco Use  . Smoking status: Never Smoker  . Smokeless tobacco: Never Used  Vaping Use  . Vaping Use: Never used  Substance and Sexual Activity  . Alcohol use: Never  . Drug use: Never  . Sexual activity: Not on file  Other Topics Concern  . Not on file  Social History Narrative  . Not on file   Social Determinants of Health   Financial Resource Strain:   .  Difficulty of Paying Living Expenses:   Food Insecurity:   . Worried About 12/28/2019 in the Last Year:   . Programme researcher, broadcasting/film/video in the Last Year:   Transportation Needs:   . Barista (Medical):   Freight forwarder Lack of Transportation (Non-Medical):   Physical Activity:   . Days of Exercise per Week:   . Minutes of Exercise per Session:   Stress:   . Feeling of Stress :   Social Connections:   . Frequency of Communication with Friends and Family:   . Frequency of Social Gatherings with Friends and Family:   . Attends Religious Services:   . Active Member of Clubs or Organizations:   . Attends Marland Kitchen Meetings:   Banker Marital Status:   Intimate Partner Violence:   . Fear of Current or Ex-Partner:   . Emotionally Abused:   Marland Kitchen Physically Abused:   . Sexually Abused:       Review of Systems  Constitutional: Negative.  Negative for chills, fatigue and unexpected weight change.  HENT: Negative.  Negative for congestion, rhinorrhea, sneezing and sore throat.   Eyes: Negative for redness.  Respiratory: Negative.  Negative for cough, chest tightness and shortness of breath.   Cardiovascular: Negative.  Negative for chest pain  and palpitations.  Gastrointestinal: Negative.  Negative for abdominal pain, constipation, diarrhea, nausea and vomiting.  Endocrine: Negative.   Genitourinary: Negative.  Negative for dysuria and frequency.  Musculoskeletal: Negative.  Negative for arthralgias, back pain, joint swelling and neck pain.  Skin: Negative.  Negative for rash.  Allergic/Immunologic: Negative.   Neurological: Negative.  Negative for tremors and numbness.  Hematological: Negative for adenopathy. Does not bruise/bleed easily.  Psychiatric/Behavioral: Negative.  Negative for behavioral problems, sleep disturbance and suicidal ideas. The patient is not nervous/anxious.     Vital Signs: BP (!) 144/93   Pulse (!) 59   Temp (!) 97.5 F (36.4 C)   Resp 16   Ht 5\' 8"   (1.727 m)   Wt 176 lb 3.2 oz (79.9 kg)   SpO2 96%   BMI 26.79 kg/m    Physical Exam Vitals and nursing note reviewed.  Constitutional:      General: He is not in acute distress.    Appearance: He is well-developed. He is not diaphoretic.  HENT:     Head: Normocephalic and atraumatic.     Mouth/Throat:     Pharynx: No oropharyngeal exudate.  Eyes:     Pupils: Pupils are equal, round, and reactive to light.  Neck:     Thyroid: No thyromegaly.     Vascular: No JVD.     Trachea: No tracheal deviation.  Cardiovascular:     Rate and Rhythm: Normal rate and regular rhythm.     Heart sounds: Normal heart sounds. No murmur heard.  No friction rub. No gallop.   Pulmonary:     Effort: Pulmonary effort is normal. No respiratory distress.     Breath sounds: Normal breath sounds. No wheezing or rales.  Chest:     Chest wall: No tenderness.  Abdominal:     Palpations: Abdomen is soft.     Tenderness: There is no abdominal tenderness. There is no guarding.  Musculoskeletal:        General: Normal range of motion.     Cervical back: Normal range of motion and neck supple.  Lymphadenopathy:     Cervical: No cervical adenopathy.  Skin:    General: Skin is warm and dry.  Neurological:     Mental Status: He is alert and oriented to person, place, and time.     Cranial Nerves: No cranial nerve deficit.  Psychiatric:        Behavior: Behavior normal.        Thought Content: Thought content normal.        Judgment: Judgment normal.     Assessment/Plan: 1. Essential hypertension Increase Norvasc to 10mg  daily.   - amLODipine (NORVASC) 10 MG tablet; Take 1 tablet (10 mg total) by mouth daily.  Dispense: 30 tablet; Refill: 0  2. Submucosal lesion of stomach Pt has appt with GI, continue with their recommendations.   3. 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  General Counseling: abdulmalik darco understanding  of the findings of todays visit and agrees with plan of treatment. I have discussed any further diagnostic evaluation that may be needed or ordered today. We also reviewed his medications today. he has been encouraged to call the office with any questions or concerns that should arise related to todays visit.    No orders of the defined types were placed in this encounter.   No orders of the defined types were placed in this encounter.  Time spent: 25 Minutes   This patient was seen by Orson Gear AGNP-C in Collaboration with Dr Lavera Guise as a part of collaborative care agreement     Kendell Bane AGNP-C Internal medicine

## 2020-01-11 ENCOUNTER — Telehealth: Payer: Self-pay

## 2020-01-11 NOTE — Telephone Encounter (Signed)
Patient rescheduled appointment on 01/15/2020 to 01/21/2020. klh

## 2020-01-15 ENCOUNTER — Ambulatory Visit: Admitting: Adult Health

## 2020-01-18 ENCOUNTER — Telehealth: Payer: Self-pay

## 2020-01-18 NOTE — Telephone Encounter (Signed)
Called lmom informing patient of appointment on 01/21/2020. klh °

## 2020-01-21 ENCOUNTER — Encounter: Payer: Self-pay | Admitting: Adult Health

## 2020-01-21 ENCOUNTER — Other Ambulatory Visit: Payer: Self-pay

## 2020-01-21 ENCOUNTER — Ambulatory Visit (INDEPENDENT_AMBULATORY_CARE_PROVIDER_SITE_OTHER): Admitting: Adult Health

## 2020-01-21 VITALS — BP 116/71 | HR 69 | Temp 97.6°F | Resp 16 | Ht 68.0 in | Wt 177.2 lb

## 2020-01-21 DIAGNOSIS — K3189 Other diseases of stomach and duodenum: Secondary | ICD-10-CM | POA: Diagnosis not present

## 2020-01-21 DIAGNOSIS — N529 Male erectile dysfunction, unspecified: Secondary | ICD-10-CM | POA: Diagnosis not present

## 2020-01-21 DIAGNOSIS — Z3189 Encounter for other procreative management: Secondary | ICD-10-CM | POA: Diagnosis not present

## 2020-01-21 DIAGNOSIS — I10 Essential (primary) hypertension: Secondary | ICD-10-CM | POA: Diagnosis not present

## 2020-01-21 MED ORDER — AMLODIPINE BESYLATE 10 MG PO TABS: 10.0000 mg | ORAL_TABLET | Freq: Every day | ORAL | 1 refills | Status: DC

## 2020-01-21 NOTE — Progress Notes (Signed)
[facility] [address] [place], [place] [address]  Internal MEDICINE  Office Visit Note  Patient Name: Scott [name]  [id number]  [id number]  Date of Service: 01/21/2020  Chief Complaint  Patient presents with  . Follow-up  . Quality Metric Gaps    covid vaccine    HPI  Pt is here for follow up on htn. And back pain.  He has seen ortho about his back.  He doe have some spinal stenosis.  They have done one epidural injection and they are planning for one more injection soon.  He will get his covid vaccines before he does the next injection per ortho recommendations. His blood pressure is much improved with 10mg  of amlodipine.  Denies Chest pain, Shortness of breath, palpitations, headache, or blurred vision.  He has an appt to see GI in august for the place found in his stomach during upper gi.     Current Medication: Outpatient Encounter Medications as of 01/21/2020  Medication Sig  . amLODipine (NORVASC) 10 MG tablet Take 1 tablet (10 mg total) by mouth daily.  . sildenafil (REVATIO) 20 MG tablet Take 1 tablet (20 mg total) by mouth daily as needed.  . [DISCONTINUED] amLODipine (NORVASC) 10 MG tablet Take 1 tablet (10 mg total) by mouth daily.   No facility-administered encounter medications on file as of 01/21/2020.    Surgical History: Past Surgical History:  Procedure Laterality Date  . FRACTURE SURGERY Right    arm    Medical History: History reviewed. No pertinent past medical history.  Family History: Family History  Problem Relation Age of Onset  . Cervical cancer Mother   . Prostate cancer Father   . Hypertension Father     Social History   Socioeconomic History  . Marital status: Married    Spouse name: Not on file  . Number of children: Not on file  . Years of education: Not on file  . Highest education level: Not on file  Occupational History  . Not on file  Tobacco Use  . Smoking status: Never Smoker  . Smokeless tobacco: Never  Used  Vaping Use  . Vaping Use: Never used  Substance and Sexual Activity  . Alcohol use: Never  . Drug use: Never  . Sexual activity: Not on file  Other Topics Concern  . Not on file  Social History Narrative  . Not on file   Social Determinants of Health   Financial Resource Strain:   . Difficulty of Paying Living Expenses:   Food Insecurity:   . Worried About 01/23/2020 in the Last Year:   . Programme researcher, broadcasting/film/video in the Last Year:   Transportation Needs:   . Barista (Medical):   Freight forwarder Lack of Transportation (Non-Medical):   Physical Activity:   . Days of Exercise per Week:   . Minutes of Exercise per Session:   Stress:   . Feeling of Stress :   Social Connections:   . Frequency of Communication with Friends and Family:   . Frequency of Social Gatherings with Friends and Family:   . Attends Religious Services:   . Active Member of Clubs or Organizations:   . Attends [employer] Meetings:   Banker Marital Status:   Intimate Partner Violence:   . Fear of Current or Ex-Partner:   . Emotionally Abused:   [employer] Physically Abused:   . Sexually Abused:       Review of Systems  Constitutional:  Negative.  Negative for chills, fatigue and unexpected weight change.  HENT: Negative.  Negative for congestion, rhinorrhea, sneezing and sore throat.   Eyes: Negative for redness.  Respiratory: Negative.  Negative for cough, chest tightness and shortness of breath.   Cardiovascular: Negative.  Negative for chest pain and palpitations.  Gastrointestinal: Negative.  Negative for abdominal pain, constipation, diarrhea, nausea and vomiting.  Endocrine: Negative.   Genitourinary: Negative.  Negative for dysuria and frequency.  Musculoskeletal: Negative.  Negative for arthralgias, back pain, joint swelling and neck pain.  Skin: Negative.  Negative for rash.  Allergic/Immunologic: Negative.   Neurological: Negative.  Negative for tremors and numbness.  Hematological:  Negative for adenopathy. Does not bruise/bleed easily.  Psychiatric/Behavioral: Negative.  Negative for behavioral problems, sleep disturbance and suicidal ideas. The patient is not nervous/anxious.     Vital Signs: BP 116/71   Pulse 69   Temp 97.6 F (36.4 C)   Resp 16   Ht 5\' 8"  (1.727 m)   Wt 177 lb 3.2 oz (80.4 kg)   SpO2 99%   BMI 26.94 kg/m    Physical Exam Vitals and nursing note reviewed.  Constitutional:      General: He is not in acute distress.    Appearance: He is well-developed. He is not diaphoretic.  HENT:     Head: Normocephalic and atraumatic.     Mouth/Throat:     Pharynx: No oropharyngeal exudate.  Eyes:     Pupils: Pupils are equal, round, and reactive to light.  Neck:     Thyroid: No thyromegaly.     Vascular: No JVD.     Trachea: No tracheal deviation.  Cardiovascular:     Rate and Rhythm: Normal rate and regular rhythm.     Heart sounds: Normal heart sounds. No murmur heard.  No friction rub. No gallop.   Pulmonary:     Effort: Pulmonary effort is normal. No respiratory distress.     Breath sounds: Normal breath sounds. No wheezing or rales.  Chest:     Chest wall: No tenderness.  Abdominal:     Palpations: Abdomen is soft.     Tenderness: There is no abdominal tenderness. There is no guarding.  Musculoskeletal:        General: Normal range of motion.     Cervical back: Normal range of motion and neck supple.  Lymphadenopathy:     Cervical: No cervical adenopathy.  Skin:    General: Skin is warm and dry.  Neurological:     Mental Status: He is alert and oriented to person, place, and time.     Cranial Nerves: No cranial nerve deficit.  Psychiatric:        Behavior: Behavior normal.        Thought Content: Thought content normal.        Judgment: Judgment normal.    Assessment/Plan: 1. Essential hypertension Continue amlodipine, good control of bp at this time  - amLODipine (NORVASC) 10 MG tablet; Take 1 tablet (10 mg total) by  mouth daily.  Dispense: 90 tablet; Refill: 1  2. Submucosal lesion of stomach See GI as scheduled.  3. Encounter for fertility planning - Rapid HIV screen (HIV 1/2 Ab+Ag) - Semen Analysis, Basic - VDRL, Serum - HgB A1c - Glucose  General Counseling: Isley verbalizes understanding of the findings of todays visit and agrees with plan of treatment. I have discussed any further diagnostic evaluation that may be needed or ordered today. We also reviewed his medications  today. he has been encouraged to call the office with any questions or concerns that should arise related to todays visit.    No orders of the defined types were placed in this encounter.   Meds ordered this encounter  Medications  . amLODipine (NORVASC) 10 MG tablet    Sig: Take 1 tablet (10 mg total) by mouth daily.    Dispense:  90 tablet    Refill:  1    Time spent:30 Minutes   This patient was seen by Blima Ledger AGNP-C in Collaboration with Dr Lyndon Code as a part of collaborative care agreement     Johnna Acosta AGNP-C Internal medicine

## 2020-02-07 ENCOUNTER — Other Ambulatory Visit: Payer: Self-pay

## 2020-02-07 ENCOUNTER — Ambulatory Visit (INDEPENDENT_AMBULATORY_CARE_PROVIDER_SITE_OTHER): Admitting: Gastroenterology

## 2020-02-07 ENCOUNTER — Encounter: Payer: Self-pay | Admitting: Gastroenterology

## 2020-02-07 VITALS — BP 113/86 | HR 62 | Ht 68.0 in | Wt 178.0 lb

## 2020-02-07 DIAGNOSIS — R933 Abnormal findings on diagnostic imaging of other parts of digestive tract: Secondary | ICD-10-CM

## 2020-02-07 NOTE — Progress Notes (Signed)
Primary Care Physician: Johnna Acosta, NP  Primary Gastroenterologist:  Dr. Midge Minium  Chief Complaint  Patient presents with  . New Patient (Initial Visit)    HPI: Scott [name] is a 54 y.o. male here for an abnormal imaging study showing a lesion in the stomach consistent with a submucosal mass.  The patient denies any early satiety fevers chills nausea vomiting black stools bloody stools or abdominal pain.  The patient had a cough and was having a work-up for the cough which initiated the various image modalities he had.  The patient reports that he had a colonoscopy approximately year ago and he is not due for another colonoscopy.  He is now sent to me for evaluation since the recommendations by radiology was for direct visualization with an upper endoscopy to better assess the lesion.  No past medical history on file.  Current Outpatient Medications  Medication Sig Dispense Refill  . amLODipine (NORVASC) 10 MG tablet Take 1 tablet (10 mg total) by mouth daily. 90 tablet 1  . ALPRAZolam (XANAX) 1 MG tablet Take 1 mg by mouth daily as needed. (Patient not taking: Reported on 02/07/2020)    . gabapentin (NEURONTIN) 300 MG capsule Take by mouth. (Patient not taking: Reported on 02/07/2020)    . meloxicam (MOBIC) 15 MG tablet Take 15 mg by mouth daily. (Patient not taking: Reported on 02/07/2020)    . sildenafil (REVATIO) 20 MG tablet Take 1 tablet (20 mg total) by mouth daily as needed. (Patient not taking: Reported on 02/07/2020) 10 tablet 0   No current facility-administered medications for this visit.    Allergies as of 02/07/2020  . (No Known Allergies)    ROS:  General: Negative for anorexia, weight loss, fever, chills, fatigue, weakness. ENT: Negative for hoarseness, difficulty swallowing , nasal congestion. CV: Negative for chest pain, angina, palpitations, dyspnea on exertion, peripheral edema.  Respiratory: Negative for dyspnea at rest, dyspnea on exertion, cough,  sputum, wheezing.  GI: See history of present illness. GU:  Negative for dysuria, hematuria, urinary incontinence, urinary frequency, nocturnal urination.  Endo: Negative for unusual weight change.    Physical Examination:   BP 113/86   Pulse 62   Ht 5\' 8"  (1.727 m)   Wt 178 lb (80.7 kg)   BMI 27.06 kg/m   General: Well-nourished, well-developed in no acute distress.  Eyes: No icterus. Conjunctivae pink. Lungs: Clear to auscultation bilaterally. Non-labored. Heart: Regular rate and rhythm, no murmurs rubs or gallops.  Abdomen: Bowel sounds are normal, nontender, nondistended, no hepatosplenomegaly or masses, no abdominal bruits or hernia , no rebound or guarding.   Extremities: No lower extremity edema. No clubbing or deformities. Neuro: Alert and oriented x 3.  Grossly intact. Skin: Warm and dry, no jaundice.   Psych: Alert and cooperative, normal mood and affect.  Labs:    Imaging Studies: No results found.  Assessment and Plan:   Scott [name] is a 54 y.o. y/o male who comes in today with a abnormal imaging showing a possible submucosal lesion in his stomach.  The lesion was reported to be on the posterior wall of the fundus.  The patient will be set up for an EGD and has been told that if a biopsy cannot be taken due to the depth of the lesion that he may need an endoscopic ultrasound.  The patient will be set up for a EGD and has been explained the plan and agrees with it.     Scott [name]  Servando Snare, MD. Clementeen Graham    Note: This dictation was prepared with Dragon dictation along with smaller phrase technology. Any transcriptional errors that result from this process are unintentional.

## 2020-02-07 NOTE — H&P (View-Only) (Signed)
Primary Care Physician: Johnna Acosta, NP  Primary Gastroenterologist:  Dr. Midge Minium  Chief Complaint  Patient presents with  . New Patient (Initial Visit)    HPI: Scott [name] is a 54 y.o. male here for an abnormal imaging study showing a lesion in the stomach consistent with a submucosal mass.  The patient denies any early satiety fevers chills nausea vomiting black stools bloody stools or abdominal pain.  The patient had a cough and was having a work-up for the cough which initiated the various image modalities he had.  The patient reports that he had a colonoscopy approximately year ago and he is not due for another colonoscopy.  He is now sent to me for evaluation since the recommendations by radiology was for direct visualization with an upper endoscopy to better assess the lesion.  No past medical history on file.  Current Outpatient Medications  Medication Sig Dispense Refill  . amLODipine (NORVASC) 10 MG tablet Take 1 tablet (10 mg total) by mouth daily. 90 tablet 1  . ALPRAZolam (XANAX) 1 MG tablet Take 1 mg by mouth daily as needed. (Patient not taking: Reported on 02/07/2020)    . gabapentin (NEURONTIN) 300 MG capsule Take by mouth. (Patient not taking: Reported on 02/07/2020)    . meloxicam (MOBIC) 15 MG tablet Take 15 mg by mouth daily. (Patient not taking: Reported on 02/07/2020)    . sildenafil (REVATIO) 20 MG tablet Take 1 tablet (20 mg total) by mouth daily as needed. (Patient not taking: Reported on 02/07/2020) 10 tablet 0   No current facility-administered medications for this visit.    Allergies as of 02/07/2020  . (No Known Allergies)    ROS:  General: Negative for anorexia, weight loss, fever, chills, fatigue, weakness. ENT: Negative for hoarseness, difficulty swallowing , nasal congestion. CV: Negative for chest pain, angina, palpitations, dyspnea on exertion, peripheral edema.  Respiratory: Negative for dyspnea at rest, dyspnea on exertion, cough,  sputum, wheezing.  GI: See history of present illness. GU:  Negative for dysuria, hematuria, urinary incontinence, urinary frequency, nocturnal urination.  Endo: Negative for unusual weight change.    Physical Examination:   BP 113/86   Pulse 62   Ht 5\' 8"  (1.727 m)   Wt 178 lb (80.7 kg)   BMI 27.06 kg/m   General: Well-nourished, well-developed in no acute distress.  Eyes: No icterus. Conjunctivae pink. Lungs: Clear to auscultation bilaterally. Non-labored. Heart: Regular rate and rhythm, no murmurs rubs or gallops.  Abdomen: Bowel sounds are normal, nontender, nondistended, no hepatosplenomegaly or masses, no abdominal bruits or hernia , no rebound or guarding.   Extremities: No lower extremity edema. No clubbing or deformities. Neuro: Alert and oriented x 3.  Grossly intact. Skin: Warm and dry, no jaundice.   Psych: Alert and cooperative, normal mood and affect.  Labs:    Imaging Studies: No results found.  Assessment and Plan:   Scott [name] is a 54 y.o. y/o male who comes in today with a abnormal imaging showing a possible submucosal lesion in his stomach.  The lesion was reported to be on the posterior wall of the fundus.  The patient will be set up for an EGD and has been told that if a biopsy cannot be taken due to the depth of the lesion that he may need an endoscopic ultrasound.  The patient will be set up for a EGD and has been explained the plan and agrees with it.     Scott [name]  Scott Puopolo, MD. FACG    Note: This dictation was prepared with Dragon dictation along with smaller phrase technology. Any transcriptional errors that result from this process are unintentional.  

## 2020-02-08 ENCOUNTER — Other Ambulatory Visit: Payer: Self-pay

## 2020-02-08 ENCOUNTER — Encounter: Payer: Self-pay | Admitting: Gastroenterology

## 2020-02-08 DIAGNOSIS — R933 Abnormal findings on diagnostic imaging of other parts of digestive tract: Secondary | ICD-10-CM

## 2020-02-13 ENCOUNTER — Other Ambulatory Visit
Admission: RE | Admit: 2020-02-13 | Discharge: 2020-02-13 | Disposition: A | Discharge status: Home / Self Care | Source: Ambulatory Visit | Attending: Gastroenterology | Admitting: Gastroenterology

## 2020-02-13 ENCOUNTER — Other Ambulatory Visit: Payer: Self-pay

## 2020-02-13 DIAGNOSIS — Z20822 Contact with and (suspected) exposure to covid-19: Secondary | ICD-10-CM | POA: Diagnosis not present

## 2020-02-13 DIAGNOSIS — Z01812 Encounter for preprocedural laboratory examination: Secondary | ICD-10-CM | POA: Diagnosis present

## 2020-02-13 LAB — SARS CORONAVIRUS 2 (TAT 6-24 HRS): SARS Coronavirus 2: NEGATIVE

## 2020-02-13 NOTE — Discharge Instructions (Signed)
General Anesthesia, Adult, Care After This sheet gives you information about how to care for yourself after your procedure. Your health care provider may also give you more specific instructions. If you have problems or questions, contact your health care provider. What can I expect after the procedure? After the procedure, the following side effects are common:  Pain or discomfort at the IV site.  Nausea.  Vomiting.  Sore throat.  Trouble concentrating.  Feeling cold or chills.  Weak or tired.  Sleepiness and fatigue.  Soreness and body aches. These side effects can affect parts of the body that were not involved in surgery. Follow these instructions at home:  For at least 24 hours after the procedure:  Have a responsible adult stay with you. It is important to have someone help care for you until you are awake and alert.  Rest as needed.  Do not: ? Participate in activities in which you could fall or become injured. ? Drive. ? Use heavy machinery. ? Drink alcohol. ? Take sleeping pills or medicines that cause drowsiness. ? Make important decisions or sign legal documents. ? Take care of children on your own. Eating and drinking  Follow any instructions from your health care provider about eating or drinking restrictions.  When you feel hungry, start by eating small amounts of foods that are soft and easy to digest (bland), such as toast. Gradually return to your regular diet.  Drink enough fluid to keep your urine pale yellow.  If you vomit, rehydrate by drinking water, juice, or clear broth. General instructions  If you have sleep apnea, surgery and certain medicines can increase your risk for breathing problems. Follow instructions from your health care provider about wearing your sleep device: ? Anytime you are sleeping, including during daytime naps. ? While taking prescription pain medicines, sleeping medicines, or medicines that make you drowsy.  Return to  your normal activities as told by your health care provider. Ask your health care provider what activities are safe for you.  Take over-the-counter and prescription medicines only as told by your health care provider.  If you smoke, do not smoke without supervision.  Keep all follow-up visits as told by your health care provider. This is important. Contact a health care provider if:  You have nausea or vomiting that does not get better with medicine.  You cannot eat or drink without vomiting.  You have pain that does not get better with medicine.  You are unable to pass urine.  You develop a skin rash.  You have a fever.  You have redness around your IV site that gets worse. Get help right away if:  You have difficulty breathing.  You have chest pain.  You have blood in your urine or stool, or you vomit blood. Summary  After the procedure, it is common to have a sore throat or nausea. It is also common to feel tired.  Have a responsible adult stay with you for the first 24 hours after general anesthesia. It is important to have someone help care for you until you are awake and alert.  When you feel hungry, start by eating small amounts of foods that are soft and easy to digest (bland), such as toast. Gradually return to your regular diet.  Drink enough fluid to keep your urine pale yellow.  Return to your normal activities as told by your health care provider. Ask your health care provider what activities are safe for you. This information is not   intended to replace advice given to you by your health care provider. Make sure you discuss any questions you have with your health care provider. Document Revised: 06/17/2017 Document Reviewed: 01/28/2017 Elsevier Patient Education  2020 Elsevier Inc.  

## 2020-02-15 ENCOUNTER — Encounter: Payer: Self-pay | Admitting: Gastroenterology

## 2020-02-15 ENCOUNTER — Ambulatory Visit: Admitting: Anesthesiology

## 2020-02-15 ENCOUNTER — Encounter
Admission: RE | Disposition: A | Discharge status: Home / Self Care | Payer: Self-pay | Source: Home / Self Care | Attending: Gastroenterology

## 2020-02-15 ENCOUNTER — Other Ambulatory Visit: Payer: Self-pay

## 2020-02-15 ENCOUNTER — Ambulatory Visit
Admission: RE | Admit: 2020-02-15 | Discharge: 2020-02-15 | Disposition: A | Discharge status: Home / Self Care | Attending: Gastroenterology | Admitting: Gastroenterology

## 2020-02-15 DIAGNOSIS — I10 Essential (primary) hypertension: Secondary | ICD-10-CM | POA: Insufficient documentation

## 2020-02-15 DIAGNOSIS — R933 Abnormal findings on diagnostic imaging of other parts of digestive tract: Secondary | ICD-10-CM | POA: Insufficient documentation

## 2020-02-15 DIAGNOSIS — Z79899 Other long term (current) drug therapy: Secondary | ICD-10-CM | POA: Insufficient documentation

## 2020-02-15 HISTORY — PX: ESOPHAGOGASTRODUODENOSCOPY (EGD) WITH PROPOFOL: SHX5813

## 2020-02-15 HISTORY — DX: Essential (primary) hypertension: I10

## 2020-02-15 SURGERY — ESOPHAGOGASTRODUODENOSCOPY (EGD) WITH PROPOFOL
Anesthesia: General

## 2020-02-15 MED ORDER — LACTATED RINGERS IV SOLN: INTRAVENOUS | Status: DC

## 2020-02-15 MED ORDER — ACETAMINOPHEN 325 MG PO TABS: 325.0000 mg | ORAL_TABLET | Freq: Once | ORAL | Status: DC

## 2020-02-15 MED ORDER — LIDOCAINE HCL (CARDIAC) PF 100 MG/5ML IV SOSY
PREFILLED_SYRINGE | INTRAVENOUS | Status: DC | PRN
  Administered 2020-02-15: 30 mg via INTRAVENOUS

## 2020-02-15 MED ORDER — GLYCOPYRROLATE 0.2 MG/ML IJ SOLN
INTRAMUSCULAR | Status: DC | PRN
  Administered 2020-02-15: .1 mg via INTRAVENOUS

## 2020-02-15 MED ORDER — SODIUM CHLORIDE 0.9 % IV SOLN: INTRAVENOUS | Status: DC

## 2020-02-15 MED ORDER — ACETAMINOPHEN 160 MG/5ML PO SOLN: 325.0000 mg | Freq: Once | ORAL | Status: DC

## 2020-02-15 MED ORDER — PROPOFOL 10 MG/ML IV BOLUS
INTRAVENOUS | Status: DC | PRN
  Administered 2020-02-15: 40 mg via INTRAVENOUS
  Administered 2020-02-15: 180 mg via INTRAVENOUS

## 2020-02-15 SURGICAL SUPPLY — 6 items
BLOCK BITE 60FR ADLT L/F GRN (MISCELLANEOUS) ×3 IMPLANT
GOWN CVR UNV OPN BCK APRN NK (MISCELLANEOUS) ×2 IMPLANT
GOWN ISOL THUMB LOOP REG UNIV (MISCELLANEOUS) ×6
KIT ENDO PROCEDURE OLY (KITS) ×3 IMPLANT
MANIFOLD NEPTUNE II (INSTRUMENTS) ×3 IMPLANT
WATER STERILE IRR 250ML POUR (IV SOLUTION) ×3 IMPLANT

## 2020-02-15 NOTE — Anesthesia Postprocedure Evaluation (Signed)
Anesthesia Post Note  Patient: Scott [name]  Procedure(s) Performed: ESOPHAGOGASTRODUODENOSCOPY (EGD) WITH PROPOFOL (N/A )     Patient location during evaluation: PACU Anesthesia Type: General Level of consciousness: awake and alert and oriented Pain management: satisfactory to patient Vital Signs Assessment: post-procedure vital signs reviewed and stable Respiratory status: spontaneous breathing, nonlabored ventilation and respiratory function stable Cardiovascular status: blood pressure returned to baseline and stable Postop Assessment: Adequate PO intake and No signs of nausea or vomiting Anesthetic complications: no   No complications documented.  Cherly Beach

## 2020-02-15 NOTE — Op Note (Signed)
[facility] Gastroenterology Patient Name: Scott [name] Procedure Date: 02/15/2020 9:20 AM MRN: [id number] Account #: [id number] Date of Birth: [birth year] Admit Type: Outpatient Age: 54 Room: [facility] OR ROOM 01 Gender: Male Note Status: Finalized Procedure:             Upper GI endoscopy Indications:           Abnormal CT of the GI tract Providers:             Midge Minium MD, MD Referring MD:          Lyndon Code, MD (Referring MD) Medicines:             Propofol per Anesthesia Complications:         No immediate complications. Procedure:             Pre-Anesthesia Assessment:                        - Prior to the procedure, a History and Physical was                         performed, and patient medications and allergies were                         reviewed. The patient's tolerance of previous                         anesthesia was also reviewed. The risks and benefits                         of the procedure and the sedation options and risks                         were discussed with the patient. All questions were                         answered, and informed consent was obtained. Prior                         Anticoagulants: The patient has taken no previous                         anticoagulant or antiplatelet agents. ASA Grade                         Assessment: II - A patient with mild systemic disease.                         After reviewing the risks and benefits, the patient                         was deemed in satisfactory condition to undergo the                         procedure.                        After obtaining informed consent, the endoscope was  passed under direct vision. Throughout the procedure,                         the patient's blood pressure, pulse, and oxygen                         saturations were monitored continuously. The                         Endosonoscope was introduced through the mouth, and                          advanced to the second part of duodenum. The upper GI                         endoscopy was accomplished without difficulty. The                         patient tolerated the procedure well. Findings:      The examined esophagus was normal.      The entire examined stomach was normal.      The examined duodenum was normal. Impression:            - Normal esophagus.                        - Normal stomach.                        - Normal examined duodenum.                        - No specimens collected. Recommendation:        - Discharge patient to home.                        - Resume previous diet.                        - Continue present medications.                        - Perform an upper endoscopic ultrasound (UEUS). Procedure Code(s):     --- Professional ---                        [phone], Esophagogastroduodenoscopy, flexible,                         transoral; diagnostic, including collection of                         specimen(s) by brushing or washing, when performed                         (separate procedure) Diagnosis Code(s):     --- Professional ---                        R93.3, Abnormal findings on diagnostic imaging of  other parts of digestive tract CPT copyright 2019 American Medical Association. All rights reserved. The codes documented in this report are preliminary and upon coder review may  be revised to meet current compliance requirements. Midge Minium MD, MD 02/15/2020 9:39:10 AM This report has been signed electronically. Number of Addenda: 0 Note Initiated On: 02/15/2020 9:20 AM Total Procedure Duration: 0 hours 3 minutes 13 seconds  Estimated Blood Loss:  Estimated blood loss: none.      [facility]

## 2020-02-15 NOTE — Anesthesia Preprocedure Evaluation (Signed)
Anesthesia Evaluation  Patient identified by MRN, date of birth, ID band Patient awake    Reviewed: Allergy & Precautions, H&P , NPO status , Patient's Chart, lab work & pertinent test results  Airway Mallampati: II  TM Distance: >3 FB Neck ROM: full    Dental no notable dental hx.    Pulmonary    Pulmonary exam normal breath sounds clear to auscultation       Cardiovascular hypertension, Normal cardiovascular exam Rhythm:regular Rate:Normal     Neuro/Psych    GI/Hepatic   Endo/Other    Renal/GU      Musculoskeletal   Abdominal   Peds  Hematology   Anesthesia Other Findings   Reproductive/Obstetrics                             Anesthesia Physical Anesthesia Plan  ASA: II  Anesthesia Plan: General   Post-op Pain Management:    Induction: Intravenous  PONV Risk Score and Plan: 2 and Treatment may vary due to age or medical condition, TIVA and Propofol infusion  Airway Management Planned: Natural Airway  Additional Equipment:   Intra-op Plan:   Post-operative Plan:   Informed Consent: I have reviewed the patients History and Physical, chart, labs and discussed the procedure including the risks, benefits and alternatives for the proposed anesthesia with the patient or authorized representative who has indicated his/her understanding and acceptance.     Dental Advisory Given  Plan Discussed with: CRNA  Anesthesia Plan Comments:         Anesthesia Quick Evaluation

## 2020-02-15 NOTE — Anesthesia Procedure Notes (Signed)
Date/Time: 02/15/2020 9:30 AM Performed by: Maree Krabbe, CRNA Pre-anesthesia Checklist: Patient identified, Emergency Drugs available, Suction available, Timeout performed and Patient being monitored Patient Re-evaluated:Patient Re-evaluated prior to induction Oxygen Delivery Method: Nasal cannula Placement Confirmation: positive ETCO2

## 2020-02-15 NOTE — Interval H&P Note (Signed)
History and Physical Interval Note:  02/15/2020 8:55 AM  Scott [name]  has presented today for surgery, with the diagnosis of Abnormal findings of GI tract R93.3.  The various methods of treatment have been discussed with the patient and family. After consideration of risks, benefits and other options for treatment, the patient has consented to  Procedure(s): ESOPHAGOGASTRODUODENOSCOPY (EGD) WITH PROPOFOL (N/A) as a surgical intervention.  The patient's history has been reviewed, patient examined, no change in status, stable for surgery.  I have reviewed the patient's chart and labs.  Questions were answered to the patient's satisfaction.     [name]

## 2020-02-15 NOTE — Transfer of Care (Signed)
Immediate Anesthesia Transfer of Care Note  Patient: Scott [name]  Procedure(s) Performed: ESOPHAGOGASTRODUODENOSCOPY (EGD) WITH PROPOFOL (N/A )  Patient Location: PACU  Anesthesia Type: General  Level of Consciousness: awake, alert  and patient cooperative  Airway and Oxygen Therapy: Patient Spontanous Breathing and Patient connected to supplemental oxygen  Post-op Assessment: Post-op Vital signs reviewed, Patient's Cardiovascular Status Stable, Respiratory Function Stable, Patent Airway and No signs of Nausea or vomiting  Post-op Vital Signs: Reviewed and stable  Complications: No complications documented.

## 2020-02-18 ENCOUNTER — Encounter: Payer: Self-pay | Admitting: Gastroenterology

## 2020-02-19 ENCOUNTER — Telehealth: Payer: Self-pay

## 2020-02-19 ENCOUNTER — Other Ambulatory Visit: Payer: Self-pay

## 2020-02-19 NOTE — Telephone Encounter (Signed)
Request received for EUS. Case sent to [facility] GI provider for review as requested.

## 2020-02-26 ENCOUNTER — Other Ambulatory Visit: Payer: Self-pay

## 2020-02-26 ENCOUNTER — Other Ambulatory Visit
Admission: RE | Admit: 2020-02-26 | Discharge: 2020-02-26 | Disposition: A | Discharge status: Home / Self Care | Source: Ambulatory Visit | Attending: Gastroenterology | Admitting: Gastroenterology

## 2020-02-26 DIAGNOSIS — Z20822 Contact with and (suspected) exposure to covid-19: Secondary | ICD-10-CM | POA: Insufficient documentation

## 2020-02-26 DIAGNOSIS — Z01812 Encounter for preprocedural laboratory examination: Secondary | ICD-10-CM | POA: Insufficient documentation

## 2020-02-26 LAB — SARS CORONAVIRUS 2 (TAT 6-24 HRS): SARS Coronavirus 2: NEGATIVE

## 2020-02-27 ENCOUNTER — Encounter: Payer: Self-pay | Admitting: *Deleted

## 2020-02-28 ENCOUNTER — Encounter
Admission: RE | Disposition: A | Discharge status: Home / Self Care | Payer: Self-pay | Source: Home / Self Care | Attending: Gastroenterology

## 2020-02-28 ENCOUNTER — Ambulatory Visit: Admitting: Anesthesiology

## 2020-02-28 ENCOUNTER — Other Ambulatory Visit: Payer: Self-pay

## 2020-02-28 ENCOUNTER — Ambulatory Visit
Admission: RE | Admit: 2020-02-28 | Discharge: 2020-02-28 | Disposition: A | Discharge status: Home / Self Care | Attending: Gastroenterology | Admitting: Gastroenterology

## 2020-02-28 ENCOUNTER — Encounter: Payer: Self-pay | Admitting: *Deleted

## 2020-02-28 DIAGNOSIS — Z791 Long term (current) use of non-steroidal anti-inflammatories (NSAID): Secondary | ICD-10-CM | POA: Diagnosis not present

## 2020-02-28 DIAGNOSIS — Z79899 Other long term (current) drug therapy: Secondary | ICD-10-CM | POA: Insufficient documentation

## 2020-02-28 DIAGNOSIS — I10 Essential (primary) hypertension: Secondary | ICD-10-CM | POA: Diagnosis not present

## 2020-02-28 DIAGNOSIS — R933 Abnormal findings on diagnostic imaging of other parts of digestive tract: Secondary | ICD-10-CM | POA: Diagnosis not present

## 2020-02-28 HISTORY — PX: EUS: SHX5427

## 2020-02-28 SURGERY — ULTRASOUND, UPPER GI TRACT, ENDOSCOPIC
Anesthesia: General

## 2020-02-28 MED ORDER — MIDAZOLAM HCL 2 MG/2ML IJ SOLN
INTRAMUSCULAR | Status: DC | PRN
  Administered 2020-02-28: 2 mg via INTRAVENOUS

## 2020-02-28 MED ORDER — SODIUM CHLORIDE 0.9 % IV SOLN
INTRAVENOUS | Status: DC
  Administered 2020-02-28: 1000 mL via INTRAVENOUS

## 2020-02-28 MED ORDER — MIDAZOLAM HCL 2 MG/2ML IJ SOLN
INTRAMUSCULAR | Status: AC
  Filled 2020-02-28: qty 2

## 2020-02-28 MED ORDER — PROPOFOL 500 MG/50ML IV EMUL
INTRAVENOUS | Status: AC
  Filled 2020-02-28: qty 50

## 2020-02-28 MED ORDER — LIDOCAINE HCL (CARDIAC) PF 100 MG/5ML IV SOSY
PREFILLED_SYRINGE | INTRAVENOUS | Status: DC | PRN
  Administered 2020-02-28: 50 mg via INTRAVENOUS

## 2020-02-28 MED ORDER — PROPOFOL 10 MG/ML IV BOLUS
INTRAVENOUS | Status: DC | PRN
  Administered 2020-02-28: 100 mg via INTRAVENOUS

## 2020-02-28 MED ORDER — LIDOCAINE HCL (PF) 2 % IJ SOLN
INTRAMUSCULAR | Status: AC
  Filled 2020-02-28: qty 5

## 2020-02-28 MED ORDER — GLYCOPYRROLATE 0.2 MG/ML IJ SOLN
INTRAMUSCULAR | Status: DC | PRN
  Administered 2020-02-28: .2 mg via INTRAVENOUS

## 2020-02-28 MED ORDER — PROPOFOL 500 MG/50ML IV EMUL
INTRAVENOUS | Status: DC | PRN
  Administered 2020-02-28: 150 ug/kg/min via INTRAVENOUS

## 2020-02-28 NOTE — Anesthesia Postprocedure Evaluation (Signed)
Anesthesia Post Note  Patient: Scott [name]  Procedure(s) Performed: FULL UPPER ENDOSCOPIC ULTRASOUND (EUS) RADIAL (N/A )  Patient location during evaluation: Endoscopy Anesthesia Type: General Level of consciousness: awake and alert Pain management: pain level controlled Vital Signs Assessment: post-procedure vital signs reviewed and stable Respiratory status: spontaneous breathing, nonlabored ventilation, respiratory function stable and patient connected to nasal cannula oxygen Cardiovascular status: blood pressure returned to baseline and stable Postop Assessment: no apparent nausea or vomiting Anesthetic complications: no   No complications documented.   Last Vitals:  Vitals:   02/28/20 1353 02/28/20 1403  BP: (!) 125/92 117/90  Pulse: 62 (!) 59  Resp: 14 12  Temp:    SpO2: 100% 100%    Last Pain:  Vitals:   02/28/20 1403  TempSrc:   PainSc: 0-No pain                 Corinda Gubler

## 2020-02-28 NOTE — Transfer of Care (Signed)
Immediate Anesthesia Transfer of Care Note  Patient: Scott [name]  Procedure(s) Performed: FULL UPPER ENDOSCOPIC ULTRASOUND (EUS) RADIAL (N/A )  Patient Location: Endoscopy Unit  Anesthesia Type:General  Level of Consciousness: drowsy and patient cooperative  Airway & Oxygen Therapy: Patient Spontanous Breathing  Post-op Assessment: Report given to RN and Post -op Vital signs reviewed and stable  Post vital signs: Reviewed and stable  Last Vitals:  Vitals Value Taken Time  BP 105/66 02/28/20 1324  Temp 36.4 C 02/28/20 1323  Pulse 72 02/28/20 1325  Resp 12 02/28/20 1325  SpO2 100 % 02/28/20 1325  Vitals shown include unvalidated device data.  Last Pain:  Vitals:   02/28/20 1323  TempSrc: Temporal  PainSc: Asleep      Patients Stated Pain Goal: 0 (02/28/20 1227)  Complications: No complications documented.

## 2020-02-28 NOTE — H&P (Signed)
PRE-PROCEDURE HISTORY AND PHYSICAL   Scott [name] presents for his scheduled Procedure(s): FULL UPPER ENDOSCOPIC ULTRASOUND (EUS) RADIAL.  The indication for the procedure(s) is abnormal imaging showing lesion in stomach/negative EGD.  There have been no significant recent changes in the patient's medical status.  Past Medical History:  Diagnosis Date  . Hypertension     Past Surgical History:  Procedure Laterality Date  . ESOPHAGOGASTRODUODENOSCOPY (EGD) WITH PROPOFOL N/A 02/15/2020   Procedure: ESOPHAGOGASTRODUODENOSCOPY (EGD) WITH PROPOFOL;  Surgeon: Midge Minium, MD;  Location: [facility];  Service: Endoscopy;  Laterality: N/A;  . FRACTURE SURGERY Right    arm    Allergies No Known Allergies  Medications ALPRAZolam, amLODipine, gabapentin, meloxicam, and sildenafil  Physical Examination  Body mass index is 26.61 kg/m. BP 124/83   Pulse 60   Temp (!) 97.2 F (36.2 C) (Temporal)   Resp 20   Ht 5\' 8"  (1.727 m)   Wt 79.4 kg   SpO2 100%   BMI 26.61 kg/m  General:   Alert,  pleasant and cooperative in NAD Head:  Normocephalic and atraumatic. Neck:  Supple; no masses or thyromegaly. Lungs:  No audible wheezes Heart:  Regular rate and rhythm. Abdomen:  Soft, nontender and nondistended. Normal bowel sounds, without guarding, and without rebound.   Neurologic:  Alert and  oriented x4;  grossly normal neurologically.  ASSESSMENT AND PLAN  Scott [name] has been evaluated and deemed appropriate to undergo the planned Procedure(s): FULL UPPER ENDOSCOPIC ULTRASOUND (EUS) RADIAL.

## 2020-02-28 NOTE — Op Note (Signed)
[facility] Gastroenterology Patient Name: Scott [name] Procedure Date: 02/28/2020 12:28 PM MRN: [id number] Account #: [id number] Date of Birth: 12-20-1965 Admit Type: Outpatient Age: 54 Room: [facility] ENDO ROOM 3 Gender: Male Note Status: Finalized Procedure:             Upper EUS Indications:           Abnormal esophagram/UGI series Patient Profile:       Refer to note in patient chart for documentation of                         history and physical. Providers:             Burtis Junes. Chevis Pretty, MD Referring MD:          Midge Minium MD, MD (Referring MD) Medicines:             Monitored Anesthesia Care Complications:         No immediate complications. Procedure:             Pre-Anesthesia Assessment:                        - Monitored anesthesia care under the supervision of a                         CRNA was determined to be medically necessary for this                         procedure based on complex procedure (ERCP, EUS).                        After obtaining informed consent, the endoscope was                         passed under direct vision. Throughout the procedure,                         the patient's blood pressure, pulse, and oxygen                         saturations were monitored continuously. The Endoscope                         was introduced through the mouth, and advanced to the                         stomach for ultrasound examination from the esophagus                         and stomach. Findings:      ENDOSCOPIC FINDING: :      The examined esophagus was endoscopically normal.      The entire examined stomach was endoscopically normal.      The examined duodenum was endoscopically normal.      ENDOSONOGRAPHIC FINDING: :      Endosonographic images of the stomach were unremarkable. No masses and       no wall thickening were identified.      There was no sign of significant endosonographic abnormality in the left       lobe of the  liver.  No masses were identified.      No lymph nodes were visualized in the celiac region (level 20) and       perigastric region.      An anechoic lesion suggestive of a cyst was identified in the superior       portion of the spleen. The lesion measured 26 mm by 26 mm in maximal       cross-sectional diameter. There was a single compartment without septae.       There was no associated mass. There was no internal debris within the       fluid-filled cavity. Impression:            EGD Impression:                        - Normal esophagus.                        - Normal stomach.                        - Normal examined duodenum.                        EUS Impression:                        - Endosonographic images of the stomach were                         unremarkable a submucosal lesion was not seen within                         the visualized portion of the stomach. Query extrinsic                         compression seen on recent UGI series.                        - There was no evidence of significant pathology in                         the left lobe of the liver.                        - No lymph nodes were visualized in the celiac region                         (level 20) and perigastric region.                        - A cystic lesion measuring 26 mm by 26 mm was                         visualized endosonographically in the spleen correlate                         with recent cross sectional imaging.                        - No specimens collected. Recommendation:        -  Discharge patient to home (via wheelchair).                        - Return to referring physician as previously                         scheduled.                        - The findings and recommendations were discussed with                         the patient and their family. Attending Participation:      I personally performed the entire procedure. Dr. Burtis Junes. Chevis Pretty, MD Burtis Junes. Everlina Gotts,  MD 02/28/2020 1:34:33 PM This report has been signed electronically. Number of Addenda: 0 Note Initiated On: 02/28/2020 12:28 PM Estimated Blood Loss:  Estimated blood loss: none.      [facility]

## 2020-02-28 NOTE — Anesthesia Preprocedure Evaluation (Addendum)
Anesthesia Evaluation  Patient identified by MRN, date of birth, ID band Patient awake    Reviewed: Allergy & Precautions, H&P , NPO status , Patient's Chart, lab work & pertinent test results  History of Anesthesia Complications Negative for: history of anesthetic complications  Airway Mallampati: II  TM Distance: >3 FB Neck ROM: full    Dental no notable dental hx. (+) Teeth Intact   Pulmonary neg sleep apnea, neg COPD, Patient abstained from smoking.Not current smoker,  PFTs showing mild restrictive lung disease, non responsive to bronchodilators   Pulmonary exam normal breath sounds clear to auscultation       Cardiovascular Exercise Tolerance: Good METShypertension, (-) CAD and (-) Past MI Normal cardiovascular exam(-) dysrhythmias  Rhythm:regular Rate:Normal     Neuro/Psych negative neurological ROS  negative psych ROS   GI/Hepatic neg GERD  ,(+)     (-) substance abuse  ,   Endo/Other  neg diabetes  Renal/GU negative Renal ROS     Musculoskeletal   Abdominal   Peds  Hematology   Anesthesia Other Findings Past Medical History: No date: Hypertension  Reproductive/Obstetrics                            Anesthesia Physical  Anesthesia Plan  ASA: II  Anesthesia Plan: General   Post-op Pain Management:    Induction: Intravenous  PONV Risk Score and Plan: 2 and Treatment may vary due to age or medical condition, TIVA and Propofol infusion  Airway Management Planned: Natural Airway  Additional Equipment: None  Intra-op Plan:   Post-operative Plan:   Informed Consent: I have reviewed the patients History and Physical, chart, labs and discussed the procedure including the risks, benefits and alternatives for the proposed anesthesia with the patient or authorized representative who has indicated his/her understanding and acceptance.     Dental Advisory Given  Plan  Discussed with: CRNA  Anesthesia Plan Comments: (Discussed risks of anesthesia with patient, including possibility of difficulty with spontaneous ventilation under anesthesia necessitating airway intervention, PONV, and rare risks such as cardiac or respiratory or neurological events. Patient understands.)       Anesthesia Quick Evaluation

## 2020-02-29 ENCOUNTER — Encounter: Payer: Self-pay | Admitting: Gastroenterology

## 2020-03-13 ENCOUNTER — Telehealth: Payer: Self-pay

## 2020-03-13 NOTE — Telephone Encounter (Signed)
Confirmed and screened for OV 9/20

## 2020-03-17 ENCOUNTER — Other Ambulatory Visit: Payer: Self-pay

## 2020-03-17 ENCOUNTER — Ambulatory Visit (INDEPENDENT_AMBULATORY_CARE_PROVIDER_SITE_OTHER): Admitting: Internal Medicine

## 2020-03-17 ENCOUNTER — Encounter: Payer: Self-pay | Admitting: Adult Health

## 2020-03-17 VITALS — BP 122/80 | HR 67 | Temp 96.9°F | Resp 16 | Ht 68.0 in | Wt 178.4 lb

## 2020-03-17 DIAGNOSIS — G479 Sleep disorder, unspecified: Secondary | ICD-10-CM | POA: Diagnosis not present

## 2020-03-17 DIAGNOSIS — N529 Male erectile dysfunction, unspecified: Secondary | ICD-10-CM

## 2020-03-17 DIAGNOSIS — I10 Essential (primary) hypertension: Secondary | ICD-10-CM | POA: Diagnosis not present

## 2020-03-17 MED ORDER — AMLODIPINE BESYLATE 10 MG PO TABS: 10.0000 mg | ORAL_TABLET | Freq: Every day | ORAL | 1 refills | Status: DC

## 2020-03-17 MED ORDER — SILDENAFIL CITRATE 20 MG PO TABS: 20.0000 mg | ORAL_TABLET | Freq: Every day | ORAL | 3 refills | Status: DC | PRN

## 2020-03-17 MED ORDER — ZOLPIDEM TARTRATE 5 MG PO TABS: 5.0000 mg | ORAL_TABLET | Freq: Every evening | ORAL | 1 refills | Status: DC | PRN

## 2020-03-17 NOTE — Progress Notes (Signed)
[facility] [address] [place], [place] [address]  Internal MEDICINE  Office Visit Note  Patient Name: Scott [name]  [id number]  [id number]  Date of Service: 03/17/2020  Chief Complaint  Patient presents with  . Follow-up    8 week fup  . Hypertension  . Quality Metric Gaps    HepC, HIV, TDAP, flu shot  . Results    upper GI review results    HPI  Pt is here for routine follow up, recent endoscopic U/S for questionable area in his stomach, tests results were normal, Colonoscopy has been normal as well. Patient admits to snoring at night, has disturbed sleep, problem falling a sleep and maintaining sleep. Patient feels tired and sluggish during the day. Pt was scheduled to have a sleep study however was cancelled due to covid pandemic. Pt has ED as well   Denies any c/p or sob   Current Medication: Outpatient Encounter Medications as of 03/17/2020  Medication Sig  . amLODipine (NORVASC) 10 MG tablet Take 1 tablet (10 mg total) by mouth daily.  . sildenafil (REVATIO) 20 MG tablet Take 1 tablet (20 mg total) by mouth daily as needed.  . [DISCONTINUED] amLODipine (NORVASC) 10 MG tablet Take 1 tablet (10 mg total) by mouth daily.  . [DISCONTINUED] sildenafil (REVATIO) 20 MG tablet Take 1 tablet (20 mg total) by mouth daily as needed.  . zolpidem (AMBIEN) 5 MG tablet Take 1 tablet (5 mg total) by mouth at bedtime as needed for sleep.  . [DISCONTINUED] ALPRAZolam (XANAX) 1 MG tablet Take 1 mg by mouth daily as needed.  (Patient not taking: Reported on 03/17/2020)  . [DISCONTINUED] gabapentin (NEURONTIN) 300 MG capsule Take by mouth.  (Patient not taking: Reported on 03/17/2020)  . [DISCONTINUED] meloxicam (MOBIC) 15 MG tablet Take 15 mg by mouth daily.  (Patient not taking: Reported on 03/17/2020)   No facility-administered encounter medications on file as of 03/17/2020.    Surgical History: Past Surgical History:  Procedure Laterality Date  .  ESOPHAGOGASTRODUODENOSCOPY (EGD) WITH PROPOFOL N/A 02/15/2020   Procedure: ESOPHAGOGASTRODUODENOSCOPY (EGD) WITH PROPOFOL;  Surgeon: Midge Minium, MD;  Location: [facility];  Service: Endoscopy;  Laterality: N/A;  . EUS N/A 02/28/2020   Procedure: FULL UPPER ENDOSCOPIC ULTRASOUND (EUS) RADIAL;  Surgeon: Doren Custard, MD;  Location: [facility];  Service: Gastroenterology;  Laterality: N/A;  . FRACTURE SURGERY Right    arm    Medical History: Past Medical History:  Diagnosis Date  . Hypertension     Family History: Family History  Problem Relation Age of Onset  . Cervical cancer Mother   . Prostate cancer Father   . Hypertension Father     Social History   Socioeconomic History  . Marital status: Married    Spouse name: Not on file  . Number of children: Not on file  . Years of education: Not on file  . Highest education level: Not on file  Occupational History  . Not on file  Tobacco Use  . Smoking status: Never Smoker  . Smokeless tobacco: Never Used  Vaping Use  . Vaping Use: Never used  Substance and Sexual Activity  . Alcohol use: Never  . Drug use: Never  . Sexual activity: Not on file  Other Topics Concern  . Not on file  Social History Narrative  . Not on file   Social Determinants of Health   Financial Resource Strain:   . Difficulty of Paying Living Expenses: Not on file  Food Insecurity:   . Worried About Programme researcher, broadcasting/film/video in the Last Year: Not on file  . Ran Out of Food in the Last Year: Not on file  Transportation Needs:   . Lack of Transportation (Medical): Not on file  . Lack of Transportation (Non-Medical): Not on file  Physical Activity:   . Days of Exercise per Week: Not on file  . Minutes of Exercise per Session: Not on file  Stress:   . Feeling of Stress : Not on file  Social Connections:   . Frequency of Communication with Friends and Family: Not on file  . Frequency of Social Gatherings with Friends and Family: Not on  file  . Attends Religious Services: Not on file  . Active Member of Clubs or Organizations: Not on file  . Attends Banker Meetings: Not on file  . Marital Status: Not on file  Intimate Partner Violence:   . Fear of Current or Ex-Partner: Not on file  . Emotionally Abused: Not on file  . Physically Abused: Not on file  . Sexually Abused: Not on file      Review of Systems  Constitutional: Negative for chills, fatigue and unexpected weight change.  HENT: Negative for congestion, postnasal drip, rhinorrhea, sneezing and sore throat.   Eyes: Negative for redness.  Respiratory: Negative for cough, chest tightness and shortness of breath.   Cardiovascular: Negative for chest pain and palpitations.  Gastrointestinal: Negative for abdominal pain, constipation, diarrhea, nausea and vomiting.  Genitourinary: Negative for dysuria and frequency.       Erectile dysfunction   Musculoskeletal: Negative for arthralgias, back pain, joint swelling and neck pain.  Skin: Negative for rash.  Neurological: Negative.  Negative for tremors and numbness.  Hematological: Negative for adenopathy. Does not bruise/bleed easily.  Psychiatric/Behavioral: Positive for sleep disturbance. Negative for behavioral problems (Depression) and suicidal ideas. The patient is not nervous/anxious.     Vital Signs: BP 122/80   Pulse 67   Temp (!) 96.9 F (36.1 C)   Resp 16   Ht 5\' 8"  (1.727 m)   Wt 178 lb 6.4 oz (80.9 kg)   SpO2 98%   BMI 27.13 kg/m    Physical Exam Constitutional:      General: He is not in acute distress.    Appearance: He is well-developed. He is not diaphoretic.  HENT:     Head: Normocephalic and atraumatic.     Mouth/Throat:     Pharynx: No oropharyngeal exudate.  Eyes:     Pupils: Pupils are equal, round, and reactive to light.  Neck:     Thyroid: No thyromegaly.     Vascular: No JVD.     Trachea: No tracheal deviation.  Cardiovascular:     Rate and Rhythm: Normal  rate and regular rhythm.     Heart sounds: Normal heart sounds. No murmur heard.  No friction rub. No gallop.   Pulmonary:     Effort: Pulmonary effort is normal. No respiratory distress.     Breath sounds: No wheezing or rales.  Chest:     Chest wall: No tenderness.  Abdominal:     General: Bowel sounds are normal.     Palpations: Abdomen is soft.  Musculoskeletal:        General: Normal range of motion.     Cervical back: Normal range of motion and neck supple.  Lymphadenopathy:     Cervical: No cervical adenopathy.  Skin:    General: Skin is  warm and dry.  Neurological:     Mental Status: He is alert and oriented to person, place, and time.     Cranial Nerves: No cranial nerve deficit.  Psychiatric:        Behavior: Behavior normal.        Thought Content: Thought content normal.        Judgment: Judgment normal.    Assessment/Plan: 1. Essential hypertension Blood pressure is well controlled  - amLODipine (NORVASC) 10 MG tablet; Take 1 tablet (10 mg total) by mouth daily.  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  Dispense: 10 tablet; Refill: 3  3. Sleep disturbance Patient has sign and symptoms of OSA ( disturbed sleep, HTN and ED). Baseline sleep study is ordered to further look into this. Long term complications of OSA was addressed with the patient. - Home sleep test - zolpidem (AMBIEN) 5 MG tablet; Take 1 tablet (5 mg total) by mouth at bedtime as needed for sleep.  Dispense: 15 tablet; Refill: 1  General Counseling: Scott [name] understanding of the findings of todays visit and agrees with plan of treatment. I have discussed any further diagnostic evaluation that may be needed or ordered today. We also reviewed his medications today. he has been encouraged to call the office with any questions or concerns that should  arise related to todays visit.    Orders Placed This Encounter  Procedures  . Home sleep test    Meds ordered this encounter  Medications  . sildenafil (REVATIO) 20 MG tablet    Sig: Take 1 tablet (20 mg total) by mouth daily as needed.    Dispense:  10 tablet    Refill:  3  . amLODipine (NORVASC) 10 MG tablet    Sig: Take 1 tablet (10 mg total) by mouth daily.    Dispense:  90 tablet    Refill:  1  . zolpidem (AMBIEN) 5 MG tablet    Sig: Take 1 tablet (5 mg total) by mouth at bedtime as needed for sleep.    Dispense:  15 tablet    Refill:  1    Total time spent: 30 Minutes Time spent includes review of chart, medications, test results, and follow up plan with the patient.      Dr Lyndon Code Internal medicine

## 2020-04-01 ENCOUNTER — Telehealth: Payer: Self-pay

## 2020-04-01 NOTE — Telephone Encounter (Signed)
Spoke with patient and we will reschedule his sleep study once he updates his pcp with [facility] and until then I cant submit anything under a different provider. Scott [name]

## 2020-04-02 ENCOUNTER — Other Ambulatory Visit: Admitting: Internal Medicine

## 2020-04-08 ENCOUNTER — Ambulatory Visit: Admitting: Internal Medicine

## 2020-04-08 LAB — HEMOGLOBIN A1C
Est. average glucose Bld gHb Est-mCnc: 91 mg/dL
Hgb A1c MFr Bld: 4.8 % (ref 4.8–5.6)

## 2020-04-08 LAB — GLUCOSE, RANDOM: Glucose: 89 mg/dL (ref 65–99)

## 2020-04-09 LAB — HBCIGM: Hep B C IgM: NEGATIVE

## 2020-04-09 LAB — HEPATITIS C ANTIBODY: Hep C Virus Ab: <0.1 s/co ratio (ref 0.0–0.9)

## 2020-04-09 LAB — HEPATITIS B CORE AB W/REFLEX: Hep B Core Total Ab: POSITIVE — AB

## 2020-04-10 LAB — SPECIMEN STATUS REPORT

## 2020-04-10 LAB — HEPATITIS B SURFACE ANTIBODY,QUALITATIVE: Hep B Surface Ab, Qual: REACTIVE

## 2020-04-15 ENCOUNTER — Telehealth: Payer: Self-pay

## 2020-04-15 NOTE — Telephone Encounter (Signed)
Faxed [facility] order for semen analysis to stephanie ([phone] and spoke with her she did received labslip

## 2020-04-16 LAB — SEMEN ANALYSIS, BASIC
Appearance: NORMAL
Concentration, Sperm: 130.9 x10E6/mL (ref >14.9)
Immotile Sperm: 18 %
Leukocyte Concentration: >1 x10E6/mL — ABNORMAL HIGH (ref <1.00)
Non-Progressive (NP): 9 %
Normal Morphology-Strict: 22 % (ref >3)
Progressive Motility (PR): 73 % (ref >31)
Progressively Motile Sperm: 134.6 x10E6
Time Collected: 1405
Time Received: 1430
Time Since Last Emission: 4 days
Total Motile Sperm: 149.9 x10E6
Total Motility (PR+NP): 82 % (ref >39)
Total Sperm in Ejaculate: 183.2 x10E6 (ref >38.9)
Volume: 1.4 mL — ABNORMAL LOW (ref >1.4)
pH: 7.5 (ref >7.1)

## 2020-04-18 ENCOUNTER — Telehealth: Payer: Self-pay

## 2020-04-18 NOTE — Telephone Encounter (Signed)
Medical record request signed and requesting records handed to patient on 04-18-20.

## 2020-04-21 ENCOUNTER — Other Ambulatory Visit
Admission: RE | Admit: 2020-04-21 | Discharge: 2020-04-21 | Disposition: A | Discharge status: Home / Self Care | Attending: Nurse Practitioner | Admitting: Nurse Practitioner

## 2020-04-21 ENCOUNTER — Other Ambulatory Visit: Payer: Self-pay

## 2020-04-21 ENCOUNTER — Encounter: Payer: Self-pay | Admitting: Nurse Practitioner

## 2020-04-21 ENCOUNTER — Ambulatory Visit (INDEPENDENT_AMBULATORY_CARE_PROVIDER_SITE_OTHER): Admitting: Nurse Practitioner

## 2020-04-21 VITALS — BP 132/90 | HR 67 | Temp 97.5°F | Resp 16 | Ht 68.0 in | Wt 176.4 lb

## 2020-04-21 DIAGNOSIS — I10 Essential (primary) hypertension: Secondary | ICD-10-CM | POA: Diagnosis not present

## 2020-04-21 DIAGNOSIS — Z202 Contact with and (suspected) exposure to infections with a predominantly sexual mode of transmission: Secondary | ICD-10-CM | POA: Diagnosis not present

## 2020-04-21 DIAGNOSIS — Z3189 Encounter for other procreative management: Secondary | ICD-10-CM | POA: Insufficient documentation

## 2020-04-21 DIAGNOSIS — G479 Sleep disorder, unspecified: Secondary | ICD-10-CM

## 2020-04-21 LAB — HIV ANTIBODY (ROUTINE TESTING W REFLEX): HIV Screen 4th Generation wRfx: NONREACTIVE

## 2020-04-21 NOTE — Progress Notes (Addendum)
[facility] [address] [place], [place] [address]  Internal MEDICINE  Office Visit Note  Patient Name: Scott [name]  [id number]  [id number]  Date of Service: 10/02/2020  Chief Complaint  Patient presents with  . Follow-up    review labs  . Hypertension  . Quality Metric Gaps    flu,tetnaus  . controlled substance form    reviewed with PT    The patient is here for follow up visit. He and his [place]acy as they have been trouble conceiving a pregnancy of their own. He has had lab work done and brought the results with him  He needed to have HI test which was missing from the labs. Unclear if test was not ordered or if it was accidentally not run by the lab. This is required lab for him to continue in the process.  All other lab results have been normal and negative for communicable diseases.       Current Medication: Outpatient Encounter Medications as of 04/21/2020  Medication Sig  . amLODipine (NORVASC) 10 MG tablet Take 1 tablet (10 mg total) by mouth daily.  . sildenafil (REVATIO) 20 MG tablet Take 1 tablet (20 mg total) by mouth daily as needed.  . zolpidem (AMBIEN) 5 MG tablet Take 1 tablet (5 mg total) by mouth at bedtime as needed for sleep.   No facility-administered encounter medications on file as of 04/21/2020.    Surgical History: Past Surgical History:  Procedure Laterality Date  . ESOPHAGOGASTRODUODENOSCOPY (EGD) WITH PROPOFOL N/A 02/15/2020   Procedure: ESOPHAGOGASTRODUODENOSCOPY (EGD) WITH PROPOFOL;  Surgeon: Midge Minium, MD;  Location: [facility];  Service: Endoscopy;  Laterality: N/A;  . EUS N/A 02/28/2020   Procedure: FULL UPPER ENDOSCOPIC ULTRASOUND (EUS) RADIAL;  Surgeon: Doren Custard, MD;  Location: [facility];  Service: Gastroenterology;  Laterality: N/A;  . FRACTURE SURGERY Right    arm    Medical History: Past Medical History:  Diagnosis Date  . Hypertension      Family History: Family History  Problem Relation Age of Onset  . Cervical cancer Mother   . Prostate cancer Father   . Hypertension Father     Social History   Socioeconomic History  . Marital status: Married    Spouse name: Not on file  . Number of children: Not on file  . Years of education: Not on file  . Highest education level: Not on file  Occupational History  . Not on file  Tobacco Use  . Smoking status: Never Smoker  . Smokeless tobacco: Never Used  Vaping Use  . Vaping Use: Never used  Substance and Sexual Activity  . Alcohol use: Never  . Drug use: Never  . Sexual activity: Not on file  Other Topics Concern  . Not on file  Social History Narrative  . Not on file   Social Determinants of Health   Financial Resource Strain: Not on file  Food Insecurity: Not on file  Transportation Needs: Not on file  Physical Activity: Not on file  Stress: Not on file  Social Connections: Not on file  Intimate Partner Violence: Not on file      Review of Systems  Constitutional: Negative for activity change, chills, fatigue and unexpected weight change.  HENT: Negative for congestion, postnasal drip, rhinorrhea, sneezing and sore throat.   Respiratory: Negative for cough, chest tightness and shortness of breath.   Cardiovascular: Negative for chest pain and  palpitations.  Gastrointestinal: Negative for abdominal pain, constipation, diarrhea, nausea and vomiting.  Endocrine: Negative for cold intolerance, heat intolerance, polydipsia and polyuria.  Musculoskeletal: Negative for arthralgias, back pain, joint swelling and neck pain.  Skin: Negative for rash.  Allergic/Immunologic: Negative for environmental allergies.  Neurological: Negative for dizziness, tremors, numbness and headaches.  Hematological: Negative for adenopathy. Does not bruise/bleed easily.  Psychiatric/Behavioral: Negative for behavioral problems (Depression), sleep disturbance and suicidal  ideas. The patient is not nervous/anxious.     Today's Vitals   04/21/20 0949  BP: 132/90  Pulse: 67  Resp: 16  Temp: (!) 97.5 F (36.4 C)  SpO2: 98%  Weight: 176 lb 6.4 oz (80 kg)  Height: 5\' 8"  (1.727 m)   Body mass index is 26.82 kg/m.  Physical Exam Vitals and nursing note reviewed.  Constitutional:      General: He is not in acute distress.    Appearance: Normal appearance. He is well-developed. He is not diaphoretic.  HENT:     Head: Normocephalic and atraumatic.     Nose: Nose normal.     Mouth/Throat:     Pharynx: No oropharyngeal exudate.  Eyes:     Pupils: Pupils are equal, round, and reactive to light.  Neck:     Thyroid: No thyromegaly.     Vascular: No JVD.     Trachea: No tracheal deviation.  Cardiovascular:     Rate and Rhythm: Normal rate and regular rhythm.     Heart sounds: Normal heart sounds. No murmur heard.  No friction rub. No gallop.   Pulmonary:     Effort: Pulmonary effort is normal. No respiratory distress.     Breath sounds: Normal breath sounds. No wheezing or rales.  Chest:     Chest wall: No tenderness.  Abdominal:     Palpations: Abdomen is soft.  Musculoskeletal:        General: Normal range of motion.     Cervical back: Normal range of motion and neck supple.  Lymphadenopathy:     Cervical: No cervical adenopathy.  Skin:    General: Skin is warm and dry.  Neurological:     Mental Status: He is alert and oriented to person, place, and time.     Cranial Nerves: No cranial nerve deficit.  Psychiatric:        Mood and Affect: Mood normal.        Behavior: Behavior normal.        Thought Content: Thought content normal.        Judgment: Judgment normal.    Assessment/Plan:  1. Essential hypertension Continue Norvasc 10 mg qd. Slightly elevated diastolic BP, monitor at home   2. Sleep disturbance Continue Ambien as before   3. Encounter for fertility planning Reviewed current lab results with the patient. All normal and  negative for communicable diseases. HIV test ordered. - HIV antibody (with reflex); Future  General Counseling: Scott [name] understanding of the findings of todays visit and agrees with plan of treatment. I have discussed any further diagnostic evaluation that may be needed or ordered today. We also reviewed his medications today. he has been encouraged to call the office with any questions or concerns that should arise related to todays visit.  This patient was seen by Marion Downer FNP Collaboration with Dr Vincent Gros as a part of collaborative care agreement  Orders Placed This Encounter  Procedures  . HIV antibody (with reflex)     Total time spent: 25  Minutes   Time spent includes review of chart, medications, test results, and follow up plan with the patient.      Dr Lavera Guise Internal medicine

## 2020-04-22 NOTE — Progress Notes (Signed)
Negative HIV

## 2020-05-10 DIAGNOSIS — Z3189 Encounter for other procreative management: Secondary | ICD-10-CM | POA: Insufficient documentation

## 2020-06-19 ENCOUNTER — Telehealth: Payer: Self-pay

## 2020-06-19 NOTE — Telephone Encounter (Signed)
Left a message and asked pt to reschedule appointment from 07/22/20. Scott [name]

## 2020-07-22 ENCOUNTER — Ambulatory Visit: Admitting: Internal Medicine

## 2020-08-01 ENCOUNTER — Ambulatory Visit: Admitting: Physician Assistant

## 2020-11-25 ENCOUNTER — Ambulatory Visit (INDEPENDENT_AMBULATORY_CARE_PROVIDER_SITE_OTHER): Admitting: Nurse Practitioner

## 2020-11-25 ENCOUNTER — Encounter: Payer: Self-pay | Admitting: Nurse Practitioner

## 2020-11-25 ENCOUNTER — Other Ambulatory Visit: Payer: Self-pay

## 2020-11-25 VITALS — BP 138/92 | HR 75 | Temp 98.1°F | Resp 16 | Ht 68.0 in | Wt 184.2 lb

## 2020-11-25 DIAGNOSIS — N529 Male erectile dysfunction, unspecified: Secondary | ICD-10-CM

## 2020-11-25 DIAGNOSIS — B9789 Other viral agents as the cause of diseases classified elsewhere: Secondary | ICD-10-CM

## 2020-11-25 DIAGNOSIS — J019 Acute sinusitis, unspecified: Secondary | ICD-10-CM | POA: Diagnosis not present

## 2020-11-25 DIAGNOSIS — I10 Essential (primary) hypertension: Secondary | ICD-10-CM

## 2020-11-25 DIAGNOSIS — G479 Sleep disorder, unspecified: Secondary | ICD-10-CM

## 2020-11-25 DIAGNOSIS — Z125 Encounter for screening for malignant neoplasm of prostate: Secondary | ICD-10-CM

## 2020-11-25 DIAGNOSIS — Z0001 Encounter for general adult medical examination with abnormal findings: Secondary | ICD-10-CM | POA: Diagnosis not present

## 2020-11-25 DIAGNOSIS — E559 Vitamin D deficiency, unspecified: Secondary | ICD-10-CM

## 2020-11-25 DIAGNOSIS — R300 Dysuria: Secondary | ICD-10-CM

## 2020-11-25 MED ORDER — SILDENAFIL CITRATE 20 MG PO TABS: 20.0000 mg | ORAL_TABLET | Freq: Every day | ORAL | 3 refills | Status: DC | PRN

## 2020-11-25 MED ORDER — LORATADINE 10 MG PO TABS: 10.0000 mg | ORAL_TABLET | Freq: Every day | ORAL | 2 refills | Status: DC

## 2020-11-25 MED ORDER — AMLODIPINE BESYLATE 10 MG PO TABS: 10.0000 mg | ORAL_TABLET | Freq: Every day | ORAL | 1 refills | Status: DC

## 2020-11-25 MED ORDER — IBUPROFEN 800 MG PO TABS: 800.0000 mg | ORAL_TABLET | Freq: Three times a day (TID) | ORAL | 0 refills | Status: DC | PRN

## 2020-11-25 NOTE — Progress Notes (Signed)
[facility] [place], [place] [address]  Internal MEDICINE  Office Visit Note  Patient Name: Scott [name]  [id number]  [id number]  Date of Service: 11/25/2020  Chief Complaint  Patient presents with  . Annual Exam    Stuffy nose, headache, coughing, fatigue, started 2 days ago  . Quality Metric Gaps    Shingrix    HPI Scott [name] presents for an annual well visit and physical exam. He has a history of hypertension, difficulty sleeping, and erectile dysfunction. He is a nonsmoker, denies alcohol use and illicit drug use. He lives at home with his wife, no children. 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 He has a family history of colorectal cancer so he will be due for his colonoscopy in 2024. He has had the COVID vaccine but no the booster. He is not interested in the booster at this time. Discussed the shingles vaccine, he will consider getting the vaccine but wants to think about it first .  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 He sees Dr. Girtha Hake who specializes in physical medicine and rehabilitation. He usually gets steroid injection in his back from her. He plans to call and make an appointment. He is already an established patient so no referral should be needed.  Due for screening for prostate cancer.   Current Medication: Outpatient Encounter Medications as of 11/25/2020  Medication Sig  . ibuprofen (ADVIL) 800 MG tablet Take 1 tablet (800 mg total) by mouth every 8 (eight) hours as needed for headache or moderate pain.  Marland Kitchen loratadine (CLARITIN) 10 MG tablet Take 1 tablet (10 mg total) by mouth daily.  Marland Kitchen zolpidem (AMBIEN) 5 MG tablet Take 1 tablet (5 mg total) by mouth at bedtime as needed for sleep.  . [DISCONTINUED] amLODipine (NORVASC) 10 MG tablet Take 1 tablet (10 mg total) by mouth daily.  . [DISCONTINUED] sildenafil (REVATIO) 20 MG tablet Take 1 tablet (20 mg total) by mouth daily as needed.  Marland Kitchen  amLODipine (NORVASC) 10 MG tablet Take 1 tablet (10 mg total) by mouth daily.  . sildenafil (REVATIO) 20 MG tablet Take 1 tablet (20 mg total) by mouth daily as needed.   No facility-administered encounter medications on file as of 11/25/2020.    Surgical History: Past Surgical History:  Procedure Laterality Date  . ESOPHAGOGASTRODUODENOSCOPY (EGD) WITH PROPOFOL N/A 02/15/2020   Procedure: ESOPHAGOGASTRODUODENOSCOPY (EGD) WITH PROPOFOL;  Surgeon: Lucilla Lame, MD;  Location: [place];  Service: Endoscopy;  Laterality: N/A;  . EUS N/A 02/28/2020   Procedure: FULL UPPER ENDOSCOPIC ULTRASOUND (EUS) RADIAL;  Surgeon: Reita Cliche, MD;  Location: [facility];  Service: Gastroenterology;  Laterality: N/A;  . FRACTURE SURGERY Right    arm    Medical History: Past Medical History:  Diagnosis Date  . Hypertension     Family History: Family History  Problem Relation Age of Onset  . Cervical cancer Mother   . Prostate cancer Father   . Hypertension Father     Social History   Socioeconomic History  . Marital status: Married    Spouse name: Not on file  . Number of children: Not on file  . Years of education: Not on file  . Highest education level: Not on file  Occupational History  . Not on file  Tobacco Use  . Smoking status: Never Smoker  . Smokeless tobacco: Never Used  Vaping Use  . Vaping Use: Never used  Substance  and Sexual Activity  . Alcohol use: Never  . Drug use: Never  . Sexual activity: Not on file  Other Topics Concern  . Not on file  Social History Narrative  . Not on file   Social Determinants of Health   Financial Resource Strain: Not on file  Food Insecurity: Not on file  Transportation Needs: Not on file  Physical Activity: Not on file  Stress: Not on file  Social Connections: Not on file  Intimate Partner Violence: Not on file      Review of Systems  Constitutional: Positive for fatigue. Negative for activity change, appetite  change, chills, fever and unexpected weight change.  HENT: Positive for congestion, rhinorrhea, sinus pressure, sinus pain and sneezing. Negative for ear pain, sore throat and trouble swallowing.   Eyes: Negative.   Respiratory: Positive for cough. Negative for chest tightness, shortness of breath and wheezing.   Cardiovascular: Negative.  Negative for chest pain.  Gastrointestinal: Negative.  Negative for abdominal pain, blood in stool, constipation, diarrhea, nausea and vomiting.  Endocrine: Negative.   Genitourinary: Negative.  Negative for difficulty urinating, dysuria, frequency, hematuria and urgency.  Musculoskeletal: Negative.  Negative for arthralgias, back pain, joint swelling, myalgias and neck pain.  Skin: Negative.  Negative for rash and wound.  Allergic/Immunologic: Negative.  Negative for immunocompromised state.  Neurological: Positive for headaches. Negative for dizziness, seizures and numbness.  Hematological: Negative.  Bruises/bleeds easily: leep study.  Psychiatric/Behavioral: Negative.  Negative for behavioral problems, self-injury and suicidal ideas. The patient is not nervous/anxious.     Vital Signs: BP (!) 138/92   Pulse 75   Temp 98.1 F (36.7 C)   Resp 16   Ht _0  (1.727 m)   Wt 184 lb 3.2 oz (83.6 kg)   SpO2 98%   BMI 28.01 kg/m    Physical Exam Constitutional:      General: He is not in acute distress.    Appearance: He is well-developed. He is not diaphoretic.  HENT:     Head: Normocephalic and atraumatic.     Mouth/Throat:     Pharynx: No oropharyngeal exudate.  Eyes:     Pupils: Pupils are equal, round, and reactive to light.  Neck:     Thyroid: No thyromegaly.     Vascular: No JVD.     Trachea: No tracheal deviation.  Cardiovascular:     Rate and Rhythm: Normal rate and regular rhythm.     Heart sounds: Normal heart sounds. No murmur heard. No friction rub. No gallop.   Pulmonary:     Effort: Pulmonary effort is normal. No  respiratory distress.     Breath sounds: No wheezing or rales.  Chest:     Chest wall: No tenderness.  Abdominal:     General: Bowel sounds are normal.     Palpations: Abdomen is soft.  Musculoskeletal:        General: Normal range of motion.     Cervical back: Normal range of motion and neck supple.  Lymphadenopathy:     Cervical: No cervical adenopathy.  Skin:    General: Skin is warm and dry.  Neurological:     Mental Status: He is alert and oriented to person, place, and time.     Cranial Nerves: No cranial nerve deficit.  Psychiatric:        Behavior: Behavior normal.        Thought Content: Thought content normal.        Judgment: Judgment normal.  Assessment/Plan:   1. Encounter for routine adult health examination with abnormal findings Age-appropriate preventive screenings discussed, annual physical exam completed. Routine labs for health maintenance ordered  - CBC with Differential/Platelet - CMP14+EGFR - TSH + free T4 - Lipid Profile  2. Acute viral sinusitis Sinusitis, most likely viral, OTC symptomatic treatment discussed.  3. Essential hypertension History of hypertension, taking amlodipine, refilled ordered.  - amLODipine (NORVASC) 10 MG tablet; Take 1 tablet (10 mg total) by mouth daily.  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  - sildenafil (REVATIO) 20 MG tablet; Take 1 tablet (20 mg total) by mouth daily as needed.  Dispense: 10 tablet; Refill: 3  5. Sleep disturbance History of difficulty sleeping, nightmares, and a sleep study done when he lived in [place] that he was told showed that he has borderline sleep apnea.  - PSG Sleep Study; Future Patient has sign and symptoms of OSA ( disturbed sleep, excessive fatigue during the day, uncontrolled bp and abnormal BMI). Baseline sleep study is ordered to further look into this. Long term  complications of OSA was addressed with the patient. 6. Vitamin D deficiency Lab ordered to rule out vitamin D deficiency.  - Vitamin D (25 hydroxy)  7. Screening for prostate cancer Age-appropriate screening for prostate cancer, lab ordered.  - PSA, total and free  8. Dysuria Routine urinalysis done, results were wnl.  - UA/M w/rflx Culture, Routine    General Counseling: kraig genis understanding of the findings of todays visit and agrees with plan of treatment. I have discussed any further diagnostic evaluation that may be needed or ordered today. We also reviewed his medications today. he has been encouraged to call the office with any questions or concerns that should arise related to todays visit.    Orders Placed This Encounter  Procedures  . PSA, total and free  . CBC with Differential/Platelet  . CMP14+EGFR  . TSH + free T4  . Vitamin D (25 hydroxy)  . Lipid Profile  . UA/M w/rflx Culture, Routine  . PSG Sleep Study    Meds ordered this encounter  Medications  . loratadine (CLARITIN) 10 MG tablet    Sig: Take 1 tablet (10 mg total) by mouth daily.    Dispense:  90 tablet    Refill:  2  . ibuprofen (ADVIL) 800 MG tablet    Sig: Take 1 tablet (800 mg total) by mouth every 8 (eight) hours as needed for headache or moderate pain.    Dispense:  90 tablet    Refill:  0  . amLODipine (NORVASC) 10 MG tablet    Sig: Take 1 tablet (10 mg total) by mouth daily.    Dispense:  90 tablet    Refill:  1  . sildenafil (REVATIO) 20 MG tablet    Sig: Take 1 tablet (20 mg total) by mouth daily as needed.    Dispense:  10 tablet    Refill:  3    Return in about 3 months (around 02/25/2021) for F/U, med refill, Raidyn Breiner PCP.   Total time spent: 30 Minutes Time spent includes review of chart, medications, test results, and follow up plan with the patient.   [place] Controlled Substance Database was reviewed by me.  This patient was seen by Jonetta Osgood, FNP-C in  collaboration with Dr. Clayborn Bigness as a part of collaborative care agreement.  Suesan Mohrmann R. Valetta Fuller, MSN, FNP-C Internal medicine

## 2020-11-26 LAB — UA/M W/RFLX CULTURE, ROUTINE
Bilirubin, UA: NEGATIVE
Glucose, UA: NEGATIVE
Ketones, UA: NEGATIVE
Leukocytes,UA: NEGATIVE
Nitrite, UA: NEGATIVE
Protein,UA: NEGATIVE
RBC, UA: NEGATIVE
Specific Gravity, UA: 1.011 (ref 1.005–1.030)
Urobilinogen, Ur: 0.2 mg/dL (ref 0.2–1.0)
pH, UA: 8 — ABNORMAL HIGH (ref 5.0–7.5)

## 2020-11-26 LAB — MICROSCOPIC EXAMINATION
Bacteria, UA: NONE SEEN
Casts: NONE SEEN /lpf
Epithelial Cells (non renal): NONE SEEN /hpf (ref 0–10)
RBC, Urine: NONE SEEN /hpf (ref 0–2)
WBC, UA: NONE SEEN /hpf (ref 0–5)

## 2021-02-18 ENCOUNTER — Telehealth: Payer: Self-pay

## 2021-02-18 ENCOUNTER — Other Ambulatory Visit: Payer: Self-pay

## 2021-02-18 ENCOUNTER — Encounter: Payer: Self-pay | Admitting: Nurse Practitioner

## 2021-02-18 ENCOUNTER — Ambulatory Visit (INDEPENDENT_AMBULATORY_CARE_PROVIDER_SITE_OTHER): Admitting: Nurse Practitioner

## 2021-02-18 VITALS — BP 121/74 | HR 61 | Temp 97.6°F | Resp 16 | Ht 68.0 in | Wt 185.4 lb

## 2021-02-18 DIAGNOSIS — R400 Somnolence: Secondary | ICD-10-CM

## 2021-02-18 DIAGNOSIS — M48061 Spinal stenosis, lumbar region without neurogenic claudication: Secondary | ICD-10-CM | POA: Diagnosis not present

## 2021-02-18 NOTE — Telephone Encounter (Signed)
[employer] referral authorization faxed to [phone] for sports medicine-Toni

## 2021-02-18 NOTE — Progress Notes (Signed)
[facility] [address] [place], [place] [address]  Internal MEDICINE  Office Visit Note  Patient Name: Scott [name]  [id number]  [id number]  Date of Service: 02/18/2021  Chief Complaint  Patient presents with   Follow-up    Referral for shots for back, pain in elbows, noticed over the last 2 to 3 weeks it has been worse, refill request, discuss sleep study, still not sleeping well, trouble falling and staying asleep    Hypertension   Quality Metric Gaps    Tdap was done recently per pt, he will get records and call us    HPI Scott [name] presents for a follow up visit for back pain and bilateral elbow pain. The pain in his elbows has gotten worse of the last 203 weeks. He is requesting refills as well. He has a sleep study ordered that has not been scheduled yet. He reprots still not sleeping well, having trouble fall asleep and staying asleep. He is active duty Electronics engineer.  Blood pressure is much improved.      Current Medication: Outpatient Encounter Medications as of 02/18/2021  Medication Sig   amLODipine (NORVASC) 10 MG tablet Take 1 tablet (10 mg total) by mouth daily.   ibuprofen (ADVIL) 800 MG tablet Take 1 tablet (800 mg total) by mouth every 8 (eight) hours as needed for headache or moderate pain.   loratadine (CLARITIN) 10 MG tablet Take 1 tablet (10 mg total) by mouth daily.   sildenafil (REVATIO) 20 MG tablet Take 1 tablet (20 mg total) by mouth daily as needed.   zolpidem (AMBIEN) 5 MG tablet Take 1 tablet (5 mg total) by mouth at bedtime as needed for sleep.   No facility-administered encounter medications on file as of 02/18/2021.    Surgical History: Past Surgical History:  Procedure Laterality Date   ESOPHAGOGASTRODUODENOSCOPY (EGD) WITH PROPOFOL N/A 02/15/2020   Procedure: ESOPHAGOGASTRODUODENOSCOPY (EGD) WITH PROPOFOL;  Surgeon: Midge Minium, MD;  Location: [facility];  Service: Endoscopy;  Laterality: N/A;   EUS N/A 02/28/2020   Procedure:  FULL UPPER ENDOSCOPIC ULTRASOUND (EUS) RADIAL;  Surgeon: Doren Custard, MD;  Location: [facility];  Service: Gastroenterology;  Laterality: N/A;   FRACTURE SURGERY Right    arm    Medical History: Past Medical History:  Diagnosis Date   Hypertension     Family History: Family History  Problem Relation Age of Onset   Cervical cancer Mother    Prostate cancer Father    Hypertension Father     Social History   Socioeconomic History   Marital status: Married    Spouse name: Not on file   Number of children: Not on file   Years of education: Not on file   Highest education level: Not on file  Occupational History   Not on file  Tobacco Use   Smoking status: Never   Smokeless tobacco: Never  Vaping Use   Vaping Use: Never used  Substance and Sexual Activity   Alcohol use: Never   Drug use: Never   Sexual activity: Not on file  Other Topics Concern   Not on file  Social History Narrative   Not on file   Social Determinants of Health   Financial Resource Strain: Not on file  Food Insecurity: Not on file  Transportation Needs: Not on file  Physical Activity: Not on file  Stress: Not on file  Social Connections: Not on file  Intimate Partner Violence: Not on file      Review  of Systems  Constitutional:  Negative for chills, fatigue and unexpected weight change.  HENT:  Negative for congestion, rhinorrhea, sneezing and sore throat.   Eyes:  Negative for redness.  Respiratory:  Negative for cough, chest tightness and shortness of breath.   Cardiovascular:  Negative for chest pain and palpitations.  Gastrointestinal:  Negative for abdominal pain, constipation, diarrhea, nausea and vomiting.  Genitourinary:  Negative for dysuria and frequency.  Musculoskeletal:  Negative for arthralgias, back pain, joint swelling and neck pain.  Skin:  Negative for rash.  Neurological: Negative.  Negative for tremors and numbness.  Hematological:  Negative for adenopathy.  Does not bruise/bleed easily.  Psychiatric/Behavioral:  Negative for behavioral problems (Depression), sleep disturbance and suicidal ideas. The patient is not nervous/anxious.    Vital Signs: BP 121/74   Pulse 61   Temp 97.6 F (36.4 C)   Resp 16   Ht 5\' 8"  (1.727 m)   Wt 185 lb 6.4 oz (84.1 kg)   SpO2 98%   BMI 28.19 kg/m    Physical Exam Vitals reviewed.  Constitutional:      Appearance: Normal appearance.  HENT:     Head: Normocephalic and atraumatic.  Eyes:     Extraocular Movements: Extraocular movements intact.     Pupils: Pupils are equal, round, and reactive to light.  Cardiovascular:     Rate and Rhythm: Normal rate and regular rhythm.  Pulmonary:     Effort: Pulmonary effort is normal. No respiratory distress.  Neurological:     Mental Status: He is alert and oriented to person, place, and time.  Psychiatric:        Mood and Affect: Mood normal.        Behavior: Behavior normal.     Assessment/Plan: 1. Foraminal stenosis of lumbar region Assisted patient in calling and getting appointment scheduled. Referral order placed for his insurance.  - Ambulatory referral to Sports Medicine  2. Daytime sleepiness Sleep study was ordered at a previous office visit, will get the sleep study scheduled.   General Counseling: othon guardia understanding of the findings of todays visit and agrees with plan of treatment. I have discussed any further diagnostic evaluation that may be needed or ordered today. We also reviewed his medications today. he has been encouraged to call the office with any questions or concerns that should arise related to todays visit.    Orders Placed This Encounter  Procedures   Ambulatory referral to Sports Medicine    No orders of the defined types were placed in this encounter.   Return in about 2 months (around 04/20/2021) for F/U sleep study , Scott [name] PCP.   Total time spent:30 Minutes Time spent includes review of chart,  medications, test results, and follow up plan with the patient.   [place] Controlled Substance Database was reviewed by me.  This patient was seen by 04/22/2021, FNP-C in collaboration with Dr. Sallyanne Kuster as a part of collaborative care agreement.   Howie Rufus R. Beverely Risen, MSN, FNP-C Internal medicine

## 2021-02-20 ENCOUNTER — Telehealth: Payer: Self-pay

## 2021-02-20 NOTE — Telephone Encounter (Signed)
Received referral authorization from [employer]. Faxed to Sports Medicine @ [phone]. Consult good from 02/12/21-02/08/22. Office visits good from 02/12/21-02/12/22-Toni

## 2021-02-24 ENCOUNTER — Ambulatory Visit: Payer: Self-pay | Admitting: Nurse Practitioner

## 2021-03-03 ENCOUNTER — Telehealth: Payer: Self-pay

## 2021-03-03 NOTE — Telephone Encounter (Signed)
PSG order was sent to [facility] on 11/25/2020. They made 3 attempts to reach pt with no answer and no call back to schedule the patient.

## 2021-03-06 ENCOUNTER — Telehealth: Payer: Self-pay

## 2021-03-06 NOTE — Telephone Encounter (Signed)
[employer] authorization faxed to [facility] sports medicine [phone]

## 2021-03-10 ENCOUNTER — Telehealth: Payer: Self-pay

## 2021-03-10 NOTE — Telephone Encounter (Signed)
Spoke with receptionist @ [facility] sports medicine to verify they have received referral and [employer] authorization I faxed to them on 03/06/21. She will have referral coordinator return my call.-Toni

## 2021-03-12 ENCOUNTER — Other Ambulatory Visit: Payer: Self-pay | Admitting: Physical Medicine & Rehabilitation

## 2021-03-12 DIAGNOSIS — M5442 Lumbago with sciatica, left side: Secondary | ICD-10-CM

## 2021-03-12 DIAGNOSIS — G8929 Other chronic pain: Secondary | ICD-10-CM

## 2021-03-12 DIAGNOSIS — M48062 Spinal stenosis, lumbar region with neurogenic claudication: Secondary | ICD-10-CM

## 2021-03-18 ENCOUNTER — Ambulatory Visit

## 2021-03-31 ENCOUNTER — Ambulatory Visit: Admission: RE | Admit: 2021-03-31 | Source: Ambulatory Visit

## 2021-04-07 ENCOUNTER — Ambulatory Visit
Admission: RE | Admit: 2021-04-07 | Discharge: 2021-04-07 | Disposition: A | Discharge status: Home / Self Care | Source: Ambulatory Visit | Attending: Physical Medicine & Rehabilitation | Admitting: Physical Medicine & Rehabilitation

## 2021-04-07 ENCOUNTER — Other Ambulatory Visit: Payer: Self-pay

## 2021-04-07 DIAGNOSIS — M5442 Lumbago with sciatica, left side: Secondary | ICD-10-CM | POA: Diagnosis not present

## 2021-04-07 DIAGNOSIS — M48062 Spinal stenosis, lumbar region with neurogenic claudication: Secondary | ICD-10-CM | POA: Diagnosis present

## 2021-04-07 DIAGNOSIS — G8929 Other chronic pain: Secondary | ICD-10-CM | POA: Insufficient documentation

## 2021-04-22 ENCOUNTER — Ambulatory Visit (INDEPENDENT_AMBULATORY_CARE_PROVIDER_SITE_OTHER): Admitting: Nurse Practitioner

## 2021-04-22 ENCOUNTER — Encounter: Payer: Self-pay | Admitting: Nurse Practitioner

## 2021-04-22 ENCOUNTER — Other Ambulatory Visit: Payer: Self-pay

## 2021-04-22 ENCOUNTER — Telehealth: Payer: Self-pay

## 2021-04-22 VITALS — BP 140/85 | HR 63 | Temp 98.5°F | Resp 16 | Ht 68.0 in | Wt 171.6 lb

## 2021-04-22 DIAGNOSIS — G479 Sleep disorder, unspecified: Secondary | ICD-10-CM

## 2021-04-22 DIAGNOSIS — M48061 Spinal stenosis, lumbar region without neurogenic claudication: Secondary | ICD-10-CM | POA: Diagnosis not present

## 2021-04-22 NOTE — Telephone Encounter (Signed)
Patient was here for a visit and stated the he has not heard back from [facility] regarding a PSG being scheduled. I advised that I would call and check on the status of the PSG order that was sent on 10/2020. Patient agreed and understood.   Called [facility] and spoke to [employer] who stated that they reached out to the patient and had 3 unsuccessful attempts and shredded the order. I asked if they could take it out of "shred" and schedule the patient she advised they would contact the manager and have her call me back.

## 2021-04-22 NOTE — Progress Notes (Signed)
[facility] [address] [place], [place] [address]  Internal MEDICINE  Office Visit Note  Patient Name: Scott [name]  [id number]  [id number]  Date of Service: 05/17/2021  Chief Complaint  Patient presents with   Follow-up    Back pain, has been going on for 6-7 years, lack of sleep, has not had SS yet    HPI Scott [name] presents for a follow up visit for chronic back pain, insomnia and need of sleep study. He is seeing Dr. Filomena Jungling for chronic back pain. He recently had an MRI of his back. He had a sleep study ordered previously but has not received a call to schedule so he plans to call the sleep clinic to schedule it.    Current Medication: Outpatient Encounter Medications as of 04/22/2021  Medication Sig   amLODipine (NORVASC) 10 MG tablet Take 1 tablet (10 mg total) by mouth daily.   ibuprofen (ADVIL) 800 MG tablet Take 1 tablet (800 mg total) by mouth every 8 (eight) hours as needed for headache or moderate pain.   loratadine (CLARITIN) 10 MG tablet Take 1 tablet (10 mg total) by mouth daily.   sildenafil (REVATIO) 20 MG tablet Take 1 tablet (20 mg total) by mouth daily as needed.   zolpidem (AMBIEN) 5 MG tablet Take 1 tablet (5 mg total) by mouth at bedtime as needed for sleep.   No facility-administered encounter medications on file as of 04/22/2021.    Surgical History: Past Surgical History:  Procedure Laterality Date   ESOPHAGOGASTRODUODENOSCOPY (EGD) WITH PROPOFOL N/A 02/15/2020   Procedure: ESOPHAGOGASTRODUODENOSCOPY (EGD) WITH PROPOFOL;  Surgeon: Midge Minium, MD;  Location: [facility];  Service: Endoscopy;  Laterality: N/A;   EUS N/A 02/28/2020   Procedure: FULL UPPER ENDOSCOPIC ULTRASOUND (EUS) RADIAL;  Surgeon: Doren Custard, MD;  Location: [facility];  Service: Gastroenterology;  Laterality: N/A;   FRACTURE SURGERY Right    arm    Medical History: Past Medical History:  Diagnosis Date   Hypertension     Family  History: Family History  Problem Relation Age of Onset   Cervical cancer Mother    Prostate cancer Father    Hypertension Father     Social History   Socioeconomic History   Marital status: Married    Spouse name: Not on file   Number of children: Not on file   Years of education: Not on file   Highest education level: Not on file  Occupational History   Not on file  Tobacco Use   Smoking status: Never   Smokeless tobacco: Never  Vaping Use   Vaping Use: Never used  Substance and Sexual Activity   Alcohol use: Never   Drug use: Never   Sexual activity: Not on file  Other Topics Concern   Not on file  Social History Narrative   Not on file   Social Determinants of Health   Financial Resource Strain: Not on file  Food Insecurity: Not on file  Transportation Needs: Not on file  Physical Activity: Not on file  Stress: Not on file  Social Connections: Not on file  Intimate Partner Violence: Not on file      Review of Systems  Constitutional:  Negative for chills, fatigue and unexpected weight change.  HENT:  Negative for congestion, rhinorrhea, sneezing and sore throat.   Eyes:  Negative for redness.  Respiratory:  Negative for cough, chest tightness and shortness of breath.   Cardiovascular:  Negative for chest pain and  palpitations.  Gastrointestinal:  Negative for abdominal pain, constipation, diarrhea, nausea and vomiting.  Genitourinary:  Negative for dysuria and frequency.  Musculoskeletal:  Positive for back pain. Negative for arthralgias, joint swelling and neck pain.  Skin:  Negative for rash.  Neurological: Negative.  Negative for tremors and numbness.  Hematological:  Negative for adenopathy. Does not bruise/bleed easily.  Psychiatric/Behavioral:  Positive for sleep disturbance. Negative for behavioral problems (Depression), self-injury and suicidal ideas. The patient is not nervous/anxious.    Vital Signs: BP 140/85   Pulse 63   Temp 98.5 F (36.9  C)   Resp 16   Ht 5\' 8"  (1.727 m)   Wt 171 lb 9.6 oz (77.8 kg)   SpO2 98%   BMI 26.09 kg/m    Physical Exam Vitals reviewed.  Constitutional:      Appearance: Normal appearance. He is normal weight.  HENT:     Head: Normocephalic and atraumatic.  Eyes:     Pupils: Pupils are equal, round, and reactive to light.  Cardiovascular:     Rate and Rhythm: Normal rate and regular rhythm.  Pulmonary:     Effort: Pulmonary effort is normal. No respiratory distress.  Neurological:     Mental Status: He is alert and oriented to person, place, and time.     Cranial Nerves: No cranial nerve deficit.     Coordination: Coordination normal.     Gait: Gait normal.  Psychiatric:        Mood and Affect: Mood normal.        Behavior: Behavior normal.       Assessment/Plan: 1. Sleep disturbance Patient given phone number to call the sleep clinic to schedule sleep study.   2. Foraminal stenosis of lumbar region Followed by Dr. , recently had MRI of lumbar spine and injections.    General Counseling: razi hickle understanding of the findings of todays visit and agrees with plan of treatment. I have discussed any further diagnostic evaluation that may be needed or ordered today. We also reviewed his medications today. he has been encouraged to call the office with any questions or concerns that should arise related to todays visit.    No orders of the defined types were placed in this encounter.   No orders of the defined types were placed in this encounter.   Return in about 4 weeks (around 05/20/2021) for F/U, pulmonary/sleep with lauren to discuss sleep study results.   Total time spent:20 Minutes Time spent includes review of chart, medications, test results, and follow up plan with the patient.   [place] Controlled Substance Database was reviewed by me.  This patient was seen by 05/22/2021, FNP-C in collaboration with Dr. Sallyanne Kuster as a part of  collaborative care agreement.   Zan Triska R. Beverely Risen, MSN, FNP-C Internal medicine

## 2021-05-25 ENCOUNTER — Ambulatory Visit: Admitting: Physician Assistant

## 2021-06-16 IMAGING — MR MR LUMBAR SPINE W/O CM
4 of 5 series · 33 of 48 positions shown · non-contrast
Comparison: None.

CLINICAL DATA: Low back pain with left leg pain

EXAM:
MRI LUMBAR SPINE WITHOUT CONTRAST
TECHNIQUE: Multiplanar, multisequence MR imaging of the lumbar spine was
performed. No intravenous contrast was administered.

[Series 5: T2 · sagittal · 4.0mm · 0.81mm/px · 8 of 15 slices shown (1 of 2)]
[im 1/15]
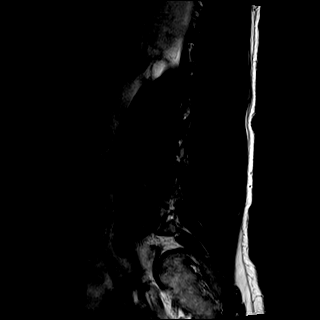
[im 3/15]
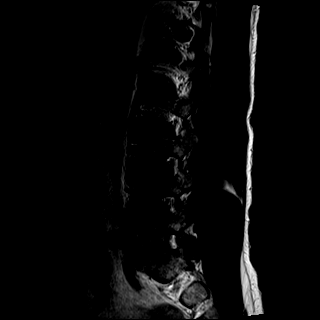
[im 5/15]
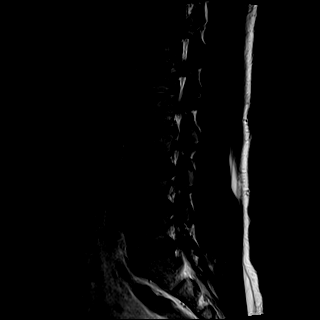
[im 7/15]
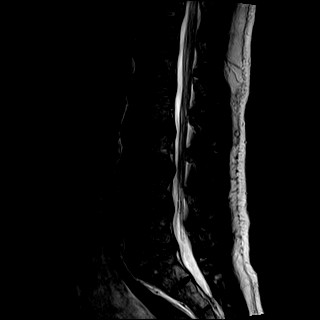
[im 9/15]
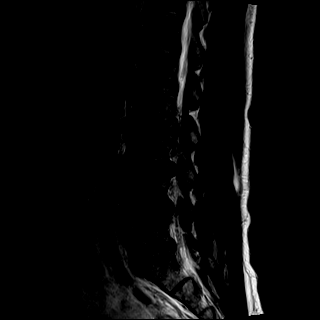
[im 11/15]
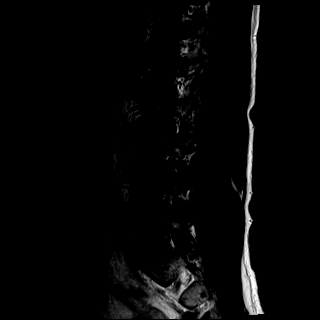
[im 13/15]
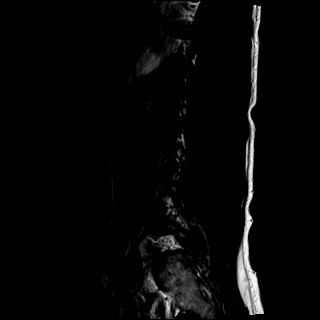
[im 15/15]
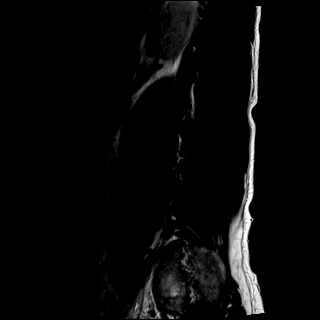

[Series 6: T1 · sagittal · 4.0mm · 0.81mm/px · 7 of 15 slices shown (1 of 2)]
[im 1/15]
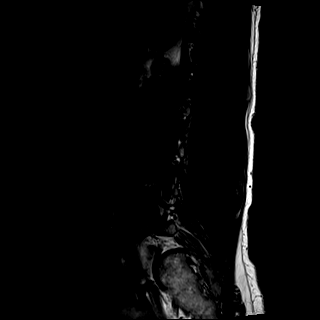
[im 3/15]
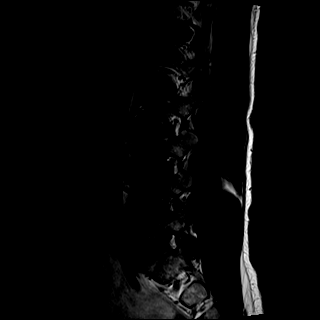
[im 5/15]
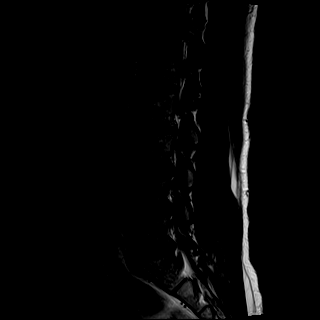
[im 8/15]
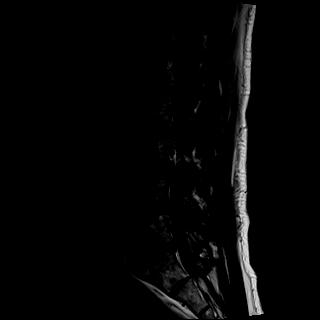
[im 10/15]
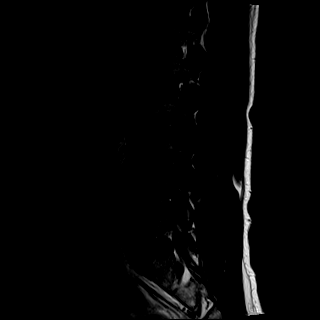
[im 12/15]
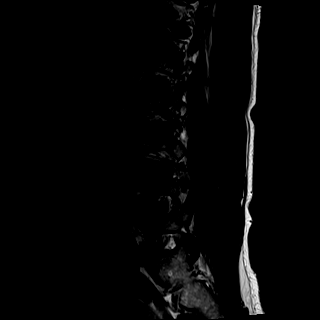
[im 15/15]
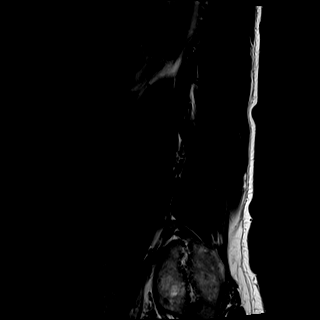

[Series 8: T2 · axial · 4.0mm · 0.78mm/px · z∈[-83,+89]mm · 9 of 28 slices shown (2 of 2)]
[im 1/28]
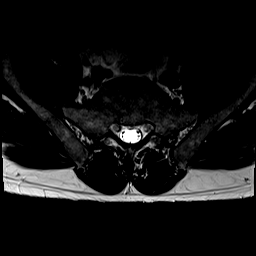
[im 5/28]
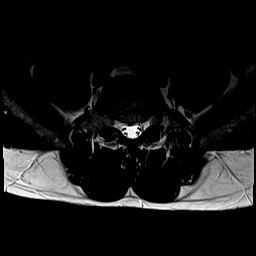
[im 10/28]
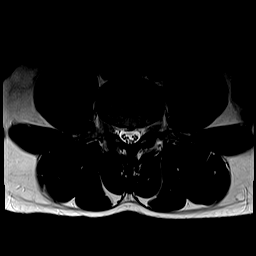
[im 12/28]
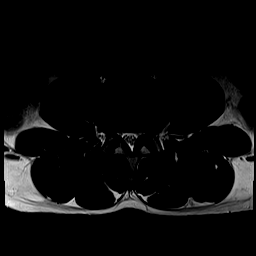
[im 14/28]
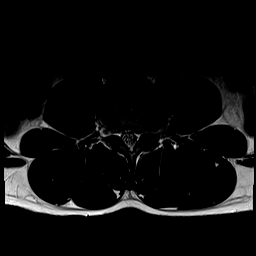
[im 16/28]
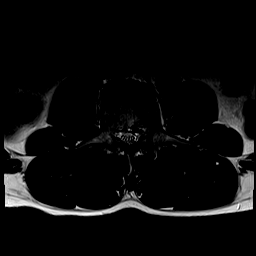
[im 19/28]
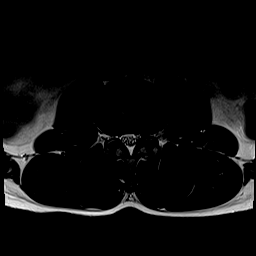
[im 23/28]
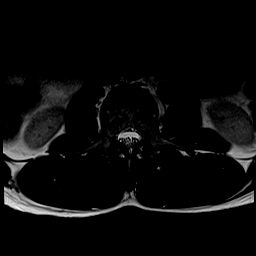
[im 28/28]
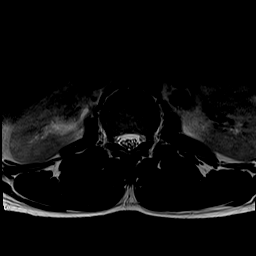

[Series 9: T1 · axial · 4.0mm · 0.39mm/px · z∈[-83,+89]mm · 9 of 28 slices shown (2 of 2)]
[im 1/28]
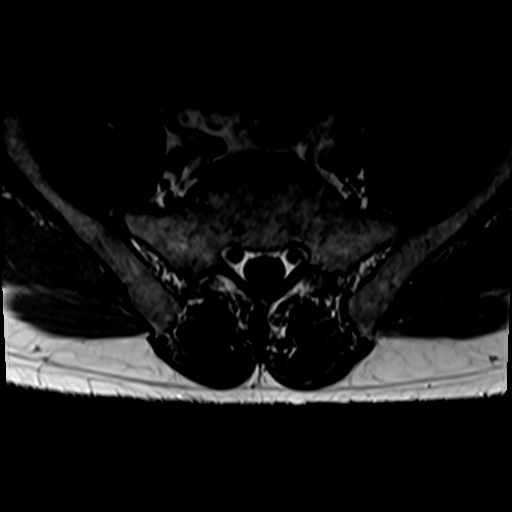
[im 5/28]
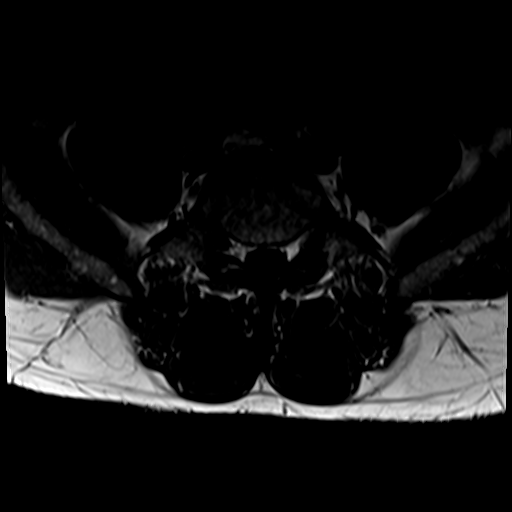
[im 10/28]
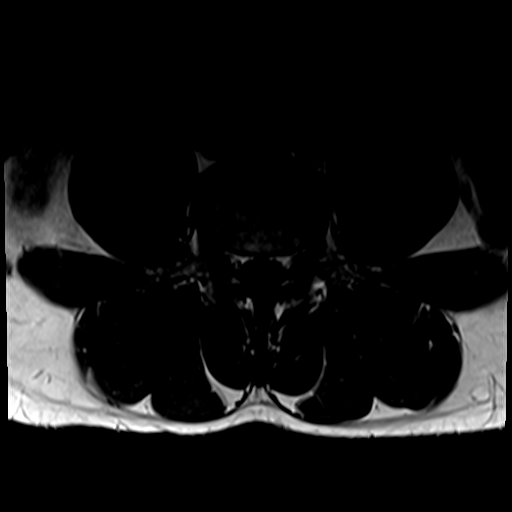
[im 12/28]
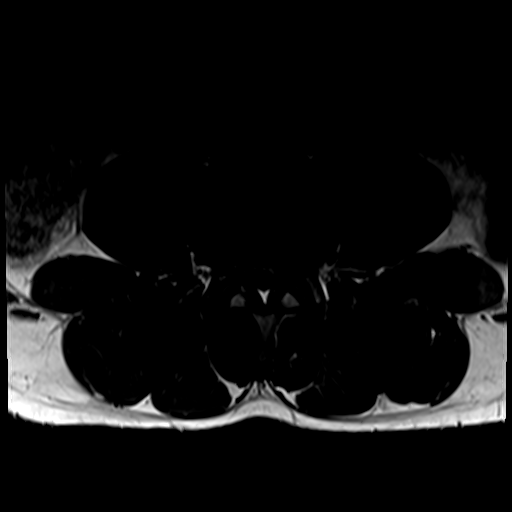
[im 14/28]
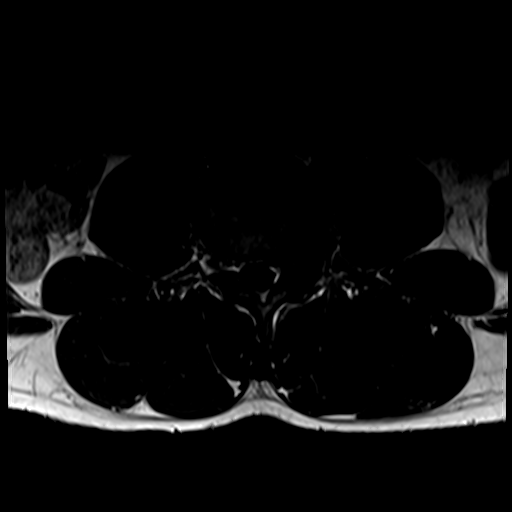
[im 16/28]
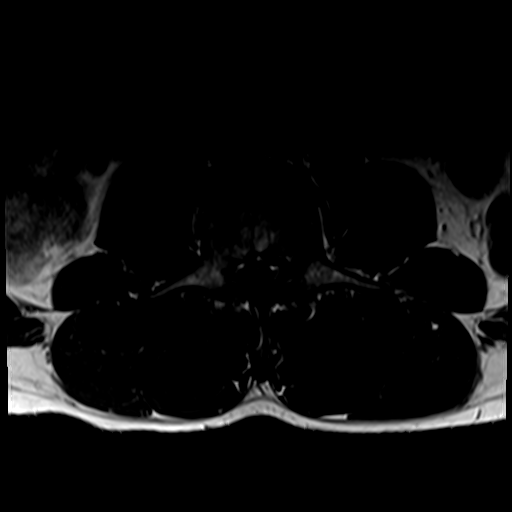
[im 19/28]
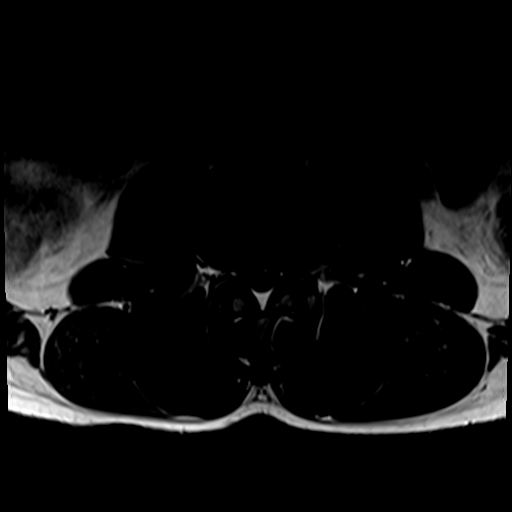
[im 23/28]
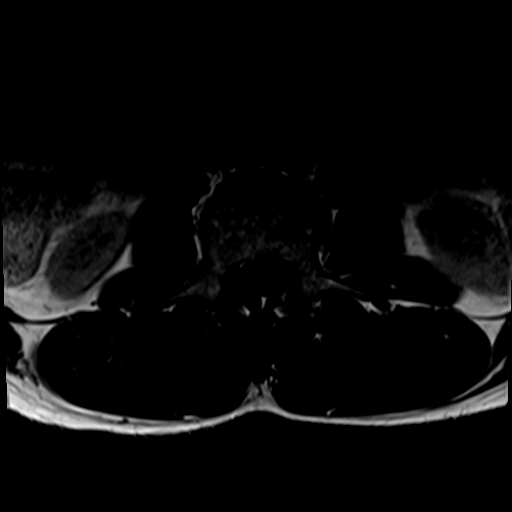
[im 28/28]
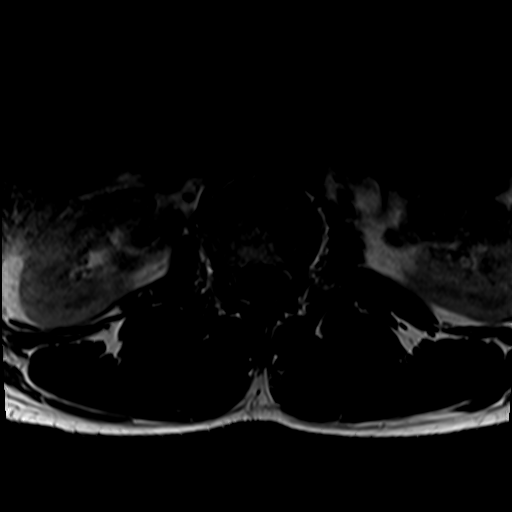

[33 of 48 positions shown; findings below may reference images not displayed]

FINDINGS: Segmentation:  Normal

Alignment:  Normal

Vertebrae:  Negative for fracture or mass.  Normal bone marrow.

Conus medullaris and cauda equina: Conus extends to the L1 level.
Conus and cauda equina appear normal.

Paraspinal and other soft tissues: Negative for paraspinous mass or
adenopathy.

Disc levels:

L1-2: Mild degenerative change.

L2-3: Disc degeneration with diffuse disc bulging. Small left
subarticular disc protrusion with extruded disc fragment extending
cranially. Left L2 nerve root impingement. Moderate subarticular
stenosis on the left. Mild spinal stenosis.

L3-4: Disc degeneration with diffuse disc bulging. Superimposed left
foraminal and extraforaminal disc protrusion with moderate
subarticular and foraminal stenosis on the left. Moderate spinal
stenosis.

L4-5: Diffuse disc bulging and bilateral facet hypertrophy. Mild
spinal stenosis and mild subarticular stenosis bilaterally

L5-S1: Shallow central disc protrusion. Negative for neural
impingement. Mild subarticular stenosis bilaterally.
IMPRESSION: 1. Left disc protrusion L2-3 with extruded disc fragment extending
cranially. Left L2 and L3 nerve root impingement. Mild spinal
stenosis
2. Moderate spinal stenosis L3-4 with left foraminal and
extraforaminal disc protrusion. Mild spinal stenosis and mild
subarticular stenosis L4-5
3. Shallow central disc protrusion L5-S1

## 2021-10-05 ENCOUNTER — Other Ambulatory Visit: Payer: Self-pay | Admitting: Nurse Practitioner

## 2021-10-05 DIAGNOSIS — B9789 Other viral agents as the cause of diseases classified elsewhere: Secondary | ICD-10-CM

## 2021-10-05 DIAGNOSIS — N529 Male erectile dysfunction, unspecified: Secondary | ICD-10-CM

## 2021-10-05 DIAGNOSIS — Z0001 Encounter for general adult medical examination with abnormal findings: Secondary | ICD-10-CM

## 2021-10-05 DIAGNOSIS — R300 Dysuria: Secondary | ICD-10-CM

## 2021-10-05 DIAGNOSIS — G479 Sleep disorder, unspecified: Secondary | ICD-10-CM

## 2021-10-05 DIAGNOSIS — E559 Vitamin D deficiency, unspecified: Secondary | ICD-10-CM

## 2021-10-05 DIAGNOSIS — I10 Essential (primary) hypertension: Secondary | ICD-10-CM

## 2021-10-05 DIAGNOSIS — Z125 Encounter for screening for malignant neoplasm of prostate: Secondary | ICD-10-CM

## 2021-10-20 ENCOUNTER — Ambulatory Visit (INDEPENDENT_AMBULATORY_CARE_PROVIDER_SITE_OTHER): Admitting: Nurse Practitioner

## 2021-10-20 ENCOUNTER — Telehealth: Payer: Self-pay

## 2021-10-20 ENCOUNTER — Encounter: Payer: Self-pay | Admitting: Nurse Practitioner

## 2021-10-20 VITALS — BP 123/88 | HR 63 | Temp 98.9°F | Resp 16 | Ht 68.0 in | Wt 186.0 lb

## 2021-10-20 DIAGNOSIS — I10 Essential (primary) hypertension: Secondary | ICD-10-CM

## 2021-10-20 DIAGNOSIS — B9789 Other viral agents as the cause of diseases classified elsewhere: Secondary | ICD-10-CM

## 2021-10-20 DIAGNOSIS — N529 Male erectile dysfunction, unspecified: Secondary | ICD-10-CM

## 2021-10-20 DIAGNOSIS — G479 Sleep disorder, unspecified: Secondary | ICD-10-CM

## 2021-10-20 DIAGNOSIS — Z9109 Other allergy status, other than to drugs and biological substances: Secondary | ICD-10-CM

## 2021-10-20 DIAGNOSIS — M48061 Spinal stenosis, lumbar region without neurogenic claudication: Secondary | ICD-10-CM

## 2021-10-20 DIAGNOSIS — E559 Vitamin D deficiency, unspecified: Secondary | ICD-10-CM

## 2021-10-20 DIAGNOSIS — Z125 Encounter for screening for malignant neoplasm of prostate: Secondary | ICD-10-CM

## 2021-10-20 DIAGNOSIS — R300 Dysuria: Secondary | ICD-10-CM

## 2021-10-20 DIAGNOSIS — R400 Somnolence: Secondary | ICD-10-CM | POA: Diagnosis not present

## 2021-10-20 DIAGNOSIS — Z0001 Encounter for general adult medical examination with abnormal findings: Secondary | ICD-10-CM

## 2021-10-20 MED ORDER — AMLODIPINE BESYLATE 10 MG PO TABS: 10.0000 mg | ORAL_TABLET | Freq: Every day | ORAL | 1 refills | Status: DC

## 2021-10-20 MED ORDER — ZOLPIDEM TARTRATE 5 MG PO TABS: 5.0000 mg | ORAL_TABLET | Freq: Every evening | ORAL | 1 refills | Status: DC | PRN

## 2021-10-20 MED ORDER — LORATADINE 10 MG PO TABS: 10.0000 mg | ORAL_TABLET | Freq: Every day | ORAL | 2 refills | Status: DC

## 2021-10-20 MED ORDER — TADALAFIL 10 MG PO TABS: 5.0000 mg | ORAL_TABLET | Freq: Every day | ORAL | 0 refills | Status: DC | PRN

## 2021-10-20 MED ORDER — IBUPROFEN 800 MG PO TABS: 800.0000 mg | ORAL_TABLET | Freq: Three times a day (TID) | ORAL | 1 refills | Status: DC | PRN

## 2021-10-20 NOTE — Telephone Encounter (Signed)
SS ordered. Printed. Gave to Titania-Toni ?

## 2021-10-20 NOTE — Progress Notes (Signed)
[facility] ?[address] ?[place], [place] [id number] ? ?Internal MEDICINE  ?Office Visit Note ? ?Patient Name: Scott [name] ? [id number]  ?[id number] ? ?Date of Service: 10/20/2021 ? ?Chief Complaint  ?Patient presents with  ? Acute Visit  ?  Unable to fall or stay asleep   ? Headache  ? ? ? ?HPI ?Scott [name] presents for an acute sick visit for difficulty sleeping, headaches and back pain.  He has been having difficulty sleeping including falling asleep and staying asleep.  He previously he has taken Ambien 5 mg at bedtime and that has helped in the past but he has not had any in a while.  He does have an infant daughter at home and she is very active so sleep is very important to him. ?His headaches are getting worse and the medication he has been taking has not been helping and would like to try something in addition to help alleviate the tension headaches/migraine. ?He still is experiencing excessive daytime sleepiness and was supposed to have a sleep study which there were issues with contacting the sleep clinic in the past and then the referral expired and he would like to reorder the sleep study and try again. ?He is having increased back pain and he has had a history of issues with his lower back and he sees Dr. Filomena JunglingJennifer Morales who is with physical and rehabilitation medicine.  Due to his insurance [employer] needing referrals and needing annual renewal of referrals, a new referral will be placed today so he is able to see the specialist without interruption. ?He is also requesting a letter for the month of May stating that he is unable to do any lifting of weights or heavy objects and no running due to his back pain for mandatory physical training in the [employer]. ? ? ?Current Medication: ? ?Outpatient Encounter Medications as of 10/20/2021  ?Medication Sig  ? tadalafil (CIALIS) 10 MG tablet Take 0.5-2 tablets (5-20 mg total) by mouth daily as needed for erectile dysfunction. Take 1 hour prior to activity.   ? [DISCONTINUED] amLODipine (NORVASC) 10 MG tablet TAKE 1 TABLET(10 MG) BY MOUTH DAILY  ? [DISCONTINUED] ibuprofen (ADVIL) 800 MG tablet Take 1 tablet (800 mg total) by mouth every 8 (eight) hours as needed for headache or moderate pain.  ? [DISCONTINUED] loratadine (CLARITIN) 10 MG tablet Take 1 tablet (10 mg total) by mouth daily.  ? [DISCONTINUED] sildenafil (REVATIO) 20 MG tablet Take 1 tablet (20 mg total) by mouth daily as needed.  ? [DISCONTINUED] zolpidem (AMBIEN) 5 MG tablet Take 1 tablet (5 mg total) by mouth at bedtime as needed for sleep.  ? amLODipine (NORVASC) 10 MG tablet Take 1 tablet (10 mg total) by mouth daily.  ? ibuprofen (ADVIL) 800 MG tablet Take 1 tablet (800 mg total) by mouth every 8 (eight) hours as needed for headache or moderate pain.  ? loratadine (CLARITIN) 10 MG tablet Take 1 tablet (10 mg total) by mouth daily.  ? zolpidem (AMBIEN) 5 MG tablet Take 1 tablet (5 mg total) by mouth at bedtime as needed for sleep.  ? ?No facility-administered encounter medications on file as of 10/20/2021.  ? ? ? ? ?Medical History: ?Past Medical History:  ?Diagnosis Date  ? Hypertension   ? ? ? ?Vital Signs: ?BP 123/88   Pulse 63   Temp 98.9 ?F (37.2 ?C)   Resp 16   Ht 5\' 8"  (1.727 m)   Wt 186 lb (84.4 kg)   SpO2  97%   BMI 28.28 kg/m?  ? ? ?Review of Systems  ?Constitutional:  Negative for chills, fatigue and unexpected weight change.  ?HENT:  Negative for congestion, rhinorrhea, sneezing and sore throat.   ?Eyes:  Negative for redness.  ?Respiratory:  Negative for cough, chest tightness and shortness of breath.   ?Cardiovascular:  Negative for chest pain and palpitations.  ?Gastrointestinal:  Negative for abdominal pain, constipation, diarrhea, nausea and vomiting.  ?Genitourinary:  Negative for dysuria and frequency.  ?Musculoskeletal:  Positive for back pain. Negative for arthralgias, joint swelling and neck pain.  ?Skin:  Negative for rash.  ?Neurological: Negative.  Negative for tremors and  numbness.  ?Hematological:  Negative for adenopathy. Does not bruise/bleed easily.  ?Psychiatric/Behavioral:  Positive for sleep disturbance. Negative for behavioral problems (Depression), self-injury and suicidal ideas. The patient is not nervous/anxious.   ? ?Physical Exam ?Vitals reviewed.  ?Constitutional:   ?   Appearance: Normal appearance. He is normal weight.  ?HENT:  ?   Head: Normocephalic and atraumatic.  ?Eyes:  ?   Pupils: Pupils are equal, round, and reactive to light.  ?Cardiovascular:  ?   Rate and Rhythm: Normal rate and regular rhythm.  ?Pulmonary:  ?   Effort: Pulmonary effort is normal. No respiratory distress.  ?Neurological:  ?   Mental Status: He is alert and oriented to person, place, and time.  ?   Cranial Nerves: No cranial nerve deficit.  ?   Coordination: Coordination normal.  ?   Gait: Gait normal.  ?Psychiatric:     ?   Mood and Affect: Mood normal.     ?   Behavior: Behavior normal.  ? ? ? ? ?Assessment/Plan: ?1. Daytime sleepiness ?Sleep study reordered ?- PSG Sleep Study; Future ? ?2. Sleep disturbance ?Sleep study reordered, ambien reordered ?- PSG Sleep Study; Future ?- zolpidem (AMBIEN) 5 MG tablet; Take 1 tablet (5 mg total) by mouth at bedtime as needed for sleep.  Dispense: 30 tablet; Refill: 1 ? ?3. Foraminal stenosis of lumbar region ?Referral ordered for renewal, refill ordered ?- Ambulatory referral to Physical Medicine Rehab ?- ibuprofen (ADVIL) 800 MG tablet; Take 1 tablet (800 mg total) by mouth every 8 (eight) hours as needed for headache or moderate pain.  Dispense: 90 tablet; Refill: 1 ? ?4. Essential hypertension ?Refills ordered ?- amLODipine (NORVASC) 10 MG tablet; Take 1 tablet (10 mg total) by mouth daily.  Dispense: 90 tablet; Refill: 1 ? 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 Take 1 hour  prior to activity.  Dispense: 10 tablet; Refill: 0 ? ?6. Environmental allergies ?- loratadine (CLARITIN) 10 MG tablet; Take 1 tablet (10 mg total) by mouth daily.  Dispense: 90 tablet; Refill: 2 ? ? ?General Counseling: tymothy cass understanding of the findings of todays visit and agrees with plan of treatment. I have discussed any further diagnostic evaluation that may be needed or ordered today. We also reviewed his medications today. he has been encouraged to call the office with any questions or concerns that should arise related to todays visit. ? ? ? ?Counseling: ? ? ? ?Orders Placed This Encounter  ?Procedures  ? Ambulatory referral to Physical Medicine Rehab  ? PSG Sleep Study  ? ? ?Meds ordered this encounter  ?Medications  ? ibuprofen (ADVIL) 800 MG tablet  ?  Sig: Take 1 tablet (800  mg total) by mouth every 8 (eight) hours as needed for headache or moderate pain.  ?  Dispense:  90 tablet  ?  Refill:  1  ? tadalafil (CIALIS) 10 MG tablet  ?  Sig: Take 0.5-2 tablets (5-20 mg total) by mouth daily as needed for erectile dysfunction. Take 1 hour prior to activity.  ?  Dispense:  10 tablet  ?  Refill:  0  ?  Please fill today, discontinue sildenafil  ? amLODipine (NORVASC) 10 MG tablet  ?  Sig: Take 1 tablet (10 mg total) by mouth daily.  ?  Dispense:  90 tablet  ?  Refill:  1  ? zolpidem (AMBIEN) 5 MG tablet  ?  Sig: Take 1 tablet (5 mg total) by mouth at bedtime as needed for sleep.  ?  Dispense:  30 tablet  ?  Refill:  1  ? loratadine (CLARITIN) 10 MG tablet  ?  Sig: Take 1 tablet (10 mg total) by mouth daily.  ?  Dispense:  90 tablet  ?  Refill:  2  ? ? ?Return for F/U SS results, new sleep study orderedd today, please tell Tat. , Amaryah Mallen PCP. ? ?[place] Controlled Substance Database was reviewed by me for overdose risk score (ORS) ? ?Time spent:30 Minutes ?Time spent with patient included reviewing progress notes, labs, imaging studies, and discussing plan for follow up.  ? ?This patient was seen by  Sallyanne Kuster, FNP-C in collaboration with Dr. Beverely Risen as a part of collaborative care agreement. ? ?Genesi Stefanko R. Tedd Sias, MSN, FNP-C ?Internal Medicine ?

## 2021-10-21 ENCOUNTER — Encounter: Payer: Self-pay | Admitting: Nurse Practitioner

## 2021-10-21 ENCOUNTER — Telehealth: Payer: Self-pay

## 2021-10-21 NOTE — Telephone Encounter (Signed)
Referral sent via [facility] to [facility] in [place]-Toni ?

## 2021-10-21 NOTE — Telephone Encounter (Signed)
Awaiting [employer] referral authorization before sending referral-Toni ?

## 2021-10-27 ENCOUNTER — Telehealth: Payer: Self-pay

## 2021-10-27 NOTE — Telephone Encounter (Signed)
Emailed work note to patient-Toni 

## 2021-10-28 NOTE — Telephone Encounter (Signed)
error 

## 2021-10-30 ENCOUNTER — Telehealth: Payer: Self-pay

## 2021-10-30 NOTE — Telephone Encounter (Signed)
Patient scheduled for PSG on 11/10/21 @ [facility] ?

## 2021-11-10 ENCOUNTER — Encounter (INDEPENDENT_AMBULATORY_CARE_PROVIDER_SITE_OTHER): Admitting: Internal Medicine

## 2021-11-10 DIAGNOSIS — G4719 Other hypersomnia: Secondary | ICD-10-CM

## 2021-11-11 ENCOUNTER — Encounter: Admitting: Nurse Practitioner

## 2021-11-19 NOTE — Progress Notes (Deleted)
 [facility]  Polysomnogram Report Part I                                                                 Phone: [phone] Fax: [phone]  Patient Name: [name], [name] Acquisition Number: [id number]  Date of Birth: 11/19/1965 Acquisition Date: 11/10/2021  Referring Physician: Alyssa R. Abernathy FNP-C     History: The patient is a 56 year old male who was referred for evaluation of possible sleep apnea with snoring and sleepiness. Medical History: headaches, lower back pain, hypertension.  Medications: tadalafil, amlodipine, ibuprofen, loratadine, zolpidem.  Procedure: This routine overnight polysomnogram was performed on the [facility] 5 using the standard diagnostic protocol. This included 6 channels of EEG, 2 channels of EOG, chin EMG, bilateral anterior tibialis EMG, nasal/oral thermistor, PTAF (nasal pressure transducer), chest and abdominal wall movements, EKG, and pulse oximetry.  Description: The total recording time was 409.0 minutes. The total sleep time was 334.0 minutes. There were a total of 72.5 minutes of wakefulness after sleep onset for a reducedsleep efficiency of 81.7%. The latency to sleep onset was shortat 2.5 minutes. The R sleep onset latency was prolonged at 203.5 minutes. Sleep parameters, as a percentage of the total sleep time, demonstrated 2.5% of sleep was in N1 sleep, 81.3% N2, 0.0% N3 and 16.2% R sleep. There were a total of 59 arousals for an arousal index of 10.6 arousals per hour of sleep that was normal.  Respiratory monitoring demonstrated frequent mild to moderate degree of snoring in all positions. There were no obstructive apneas, hypopneas or respiratory effort related arousals (RERAs) observed during the recording.  The baseline oxygen saturation during wakefulness was 97%, during NREM sleep averaged 95%, and during REM sleep averaged 71%.  The total duration of oxygen < 90% was 23.2 minutes.  Cardiac monitoring There were no  significant cardiac rhythm irregularities.   Periodic limb movement monitoring did not demonstrate periodic limb movements during sleep, however very frequent quasi-periodic limb movements were observed during periods of wakefulness.   Impression: This routine overnight polysomnogram did not demonstrate obstructive sleep apnea. There were no obstructive apneas, hypopneas or significant RERAs during the recording.  Very frequent quasi-periodic limb movements were observed during periods of wakefulness. The patient reports difficulty initiating and maintaining sleep.   There was a reduced sleep efficiency with a slightly reduced REM percentage and no slow wave sleep. These findings may be due to the limb movements.  Recommendations:     Would recommend evaluation for possible restless leg syndrome.     Samanthan Dugo A. Logan [place], MD, FCCP Diplomate ABMS-Pulmonary, Critical Care and Sleep Medicine  Electronically reviewed and digitally signed   [facility] Polysomnogram Report Part II  Phone: [phone] Fax: [phone]  Patient last name [place] Neck Size    in. Acquisition [id number]  Patient first name [name] Weight 175.0 lbs. Started 11/10/2021 at 10:45:50 PM  Birth date 12/12/1965 Height 68.0 in. Stopped 11/11/2021 at 5:41:20 AM  Age 56 BMI 26.6 lb/in2 Duration 409.0  Study Type Adult      Chris Smith Sleep Tech, Jason C. RPSGT  Reviewed by: Kathe G. Henke, PhD, ABSM, FAASM Sleep Data: Lights Out: 10:52:20 PM Sleep Onset: 10:54:50 PM  Lights On: 5:41:20   AM Sleep Efficiency: 81.7 %  Total Recording Time: 409.0 min Sleep Latency (from Lights Off) 2.5 min  Total Sleep Time (TST): 334.0 min R Latency (from Sleep Onset): 203.5 min  Sleep Period Time: 406.0 min Total number of awakenings: 15  Wake during sleep: 72.0 min Wake After Sleep Onset (WASO): 72.5 min   Sleep Data:         Arousal Summary: Stage  Latency from lights out (min) Latency from sleep onset (min) Duration  (min) % Total Sleep Time  Normal values  N 1 121.0 118.5 8.5 2.5 (5%)  N 2 2.5 0.0 271.5 81.3 (50%)  N 3       0.0 0.0 (20%)  R 206.0 203.5 54.0 16.2 (25%)    Number Index  Spontaneous 59 10.6  Apneas & Hypopneas 0 0.0  RERAs 0 0.0       (Apneas & Hypopneas & RERAs)  (0) (0.0)  Limb Movement 0 0.0  Snore 0 0.0  TOTAL 59 10.6     Respiratory Data:  CA OA MA Apnea Hypopnea* A+ H RERA Total  Number 0 0 0 0 0 0 0 0  Mean Dur (sec) 0.0 0.0 0.0 0.0 0.0 0.0 0.0 0.0  Max Dur (sec) 0.0 0.0 0.0 0.0 0.0 0.0 0.0 0.0  Total Dur (min) 0.0 0.0 0.0 0.0 0.0 0.0 0.0 0.0  % of TST 0.0 0.0 0.0 0.0 0.0 0.0 0.0 0.0  Index (#/h TST) 0.0 0.0 0.0 0.0 0.0 0.0 0.0 0.0  *Hypopneas scored based on 4% or greater desaturation.  Sleep Stage:        REM NREM TST  AHI 0.0 0.0 0.0  RDI 0.0 0.0 0.0           Body Position Data:  Sleep (min) TST (%) REM (min) NREM (min) CA (#) OA (#) MA (#) HYP (#) AHI (#/h) RERA (#) RDI (#/h) Desat (#)  Supine 52.0 15.57 0.0 52.0 0 0 0 0 0.0 0 0.00 8  Non-Supine 282.00 84.43 54.00 228.00 0.00 0.00 0.00 0.00 0.00 0 0.00 49.00  Right: 282.0 84.43 54.0 228.0 0 0 0 0 0.0 0 0.00 49  UP: 0.0 0.00 0.0 0.0 0 0 0 0 0.0 0 0.00 0     Snoring: Total number of snoring episodes  0  Total time with snoring    min (   % of sleep)   Oximetry Distribution:             WK REM NREM TOTAL  Average (%)   97 71 95 92  < 90% 0.5 14.7 8.0 23.2  < 80% 0.3 14.1 8.0 22.4  < 70% 0.3 14.1 8.0 22.4  # of Desaturations* 2 11 44 57  Desat Index (#/hour) 2.0 12.5 9.4 10.3  Desat Max (%) 4 22 12 22  Desat Max Dur (sec) 23.0 44.0 86.0 86.0  Approx Min O2 during sleep 77  Approx min O2 during a respiratory event     Was Oxygen added (Y/N) and final rate No:   0 LPM  *Desaturations based on 3% or greater drop from baseline.   Cheyne Stokes Breathing: None Present   Heart Rate Summary:  Average Heart Rate During Sleep 45.5 bpm      Highest Heart Rate During Sleep (95th %)  55.0 bpm      Highest Heart Rate During Sleep 138 bpm      Highest Heart Rate During Recording (TIB) 162 bpm (artifact)   Heart   Rate Observations: Event Type # Events   Bradycardia 0 Lowest HR Scored: N/A  Sinus Tachycardia During Sleep 0 Highest HR Scored: N/A  Narrow Complex Tachycardia 0 Highest HR Scored: N/A  Wide Complex Tachycardia 0 Highest HR Scored: N/A  Asystole 0 Longest Pause: N/A  Atrial Fibrillation 0 Duration Longest Event: N/A  Other Arrythmias  No Type:    Periodic Limb Movement Data: (Primary legs unless otherwise noted) Total # Limb Movement 0 Limb Movement Index 0.0  Total # PLMS    PLMS Index     Total # PLMS Arousals    PLMS Arousal Index     Percentage Sleep Time with PLMS   min (   % sleep)  Mean Duration limb movements (secs)          

## 2021-11-19 NOTE — Procedures (Signed)
[place] Report Part I                                                                 Phone: [phone] Fax: [phone]  Patient Name: Scott [name], Scott [name]. Acquisition Number: [id number]  Date of Birth: 1965/12/11 Acquisition Date: 11/10/2021  Referring Physician: Yetta Flock R. Abernathy FNP-C     History: The patient is a 56 year old male who was referred for evaluation of possible sleep apnea with snoring and sleepiness. Medical History: headaches, lower back pain, hypertension.  Medications: tadalafil, amlodipine, ibuprofen, loratadine, zolpidem.  Procedure: This routine overnight polysomnogram was performed on the [facility] 5 using the standard diagnostic protocol. This included 6 channels of EEG, 2 channels of EOG, chin EMG, bilateral anterior tibialis EMG, nasal/oral thermistor, PTAF (nasal pressure transducer), chest and abdominal wall movements, EKG, and pulse oximetry.  Description: The total recording time was 409.0 minutes. The total sleep time was 334.0 minutes. There were a total of 72.5 minutes of wakefulness after sleep onset for a reducedsleep efficiency of 81.7%. The latency to sleep onset was shortat 2.5 minutes. The R sleep onset latency was prolonged at 203.5 minutes. Sleep parameters, as a percentage of the total sleep time, demonstrated 2.5% of sleep was in N1 sleep, 81.3% N2, 0.0% N3 and 16.2% R sleep. There were a total of 59 arousals for an arousal index of 10.6 arousals per hour of sleep that was normal.  Respiratory monitoring demonstrated frequent mild to moderate degree of snoring in all positions. There were no obstructive apneas, hypopneas or respiratory effort related arousals (RERAs) observed during the recording.  The baseline oxygen saturation during wakefulness was 97%, during NREM sleep averaged 95%, and during REM sleep averaged 71%.  The total duration of oxygen < 90% was 23.2 minutes.  Cardiac monitoring There were no  significant cardiac rhythm irregularities.   Periodic limb movement monitoring did not demonstrate periodic limb movements during sleep, however very frequent quasi-periodic limb movements were observed during periods of wakefulness.   Impression: This routine overnight polysomnogram did not demonstrate obstructive sleep apnea. There were no obstructive apneas, hypopneas or significant RERAs during the recording.  Very frequent quasi-periodic limb movements were observed during periods of wakefulness. The patient reports difficulty initiating and maintaining sleep.   There was a reduced sleep efficiency with a slightly reduced REM percentage and no slow wave sleep. These findings may be due to the limb movements.  Recommendations:     Would recommend evaluation for possible restless leg syndrome.     Allyne Gee, MD, [facility] Diplomate ABMS-Pulmonary, Critical Care and Sleep Medicine  Electronically reviewed and digitally signed   [place] Report Part II  Phone: [phone] Fax: [phone]  Patient last name [place] Neck Size    in. Acquisition [id number]  Patient first name Micheal O. Weight 175.0 lbs. Started 11/10/2021 at 10:45:50 PM  Birth date 1965/09/09 Height 68.0 in. Stopped 11/11/2021 at 5:41:20 AM  Age 29 BMI 26.6 lb/in2 Duration 409.0  Study Type Adult      Margaretmary Eddy Sleep Tech, [place] by: Richelle Ito. Saunders Glance, PhD, ABSM, FAASM Sleep Data: Lights Out: 10:52:20 PM Sleep Onset: 10:54:50 PM  Lights On: 5:41:20  AM Sleep Efficiency: 81.7 %  Total Recording Time: 409.0 min Sleep Latency (from Lights Off) 2.5 min  Total Sleep Time (TST): 334.0 min R Latency (from Sleep Onset): 203.5 min  Sleep Period Time: 406.0 min Total number of awakenings: 15  Wake during sleep: 72.0 min Wake After Sleep Onset (WASO): 72.5 min   Sleep Data:         Arousal Summary: Stage  Latency from lights out (min) Latency from sleep onset (min) Duration  (min) % Total Sleep Time  Normal values  N 1 121.0 118.5 8.5 2.5 (5%)  N 2 2.5 0.0 271.5 81.3 (50%)  N 3       0.0 0.0 (20%)  R 206.0 203.5 54.0 16.2 (25%)    Number Index  Spontaneous 59 10.6  Apneas & Hypopneas 0 0.0  RERAs 0 0.0       (Apneas & Hypopneas & RERAs)  (0) (0.0)  Limb Movement 0 0.0  Snore 0 0.0  TOTAL 59 10.6     Respiratory Data:  CA OA MA Apnea Hypopnea* A+ H RERA Total  Number 0 0 0 0 0 0 0 0  Mean Dur (sec) 0.0 0.0 0.0 0.0 0.0 0.0 0.0 0.0  Max Dur (sec) 0.0 0.0 0.0 0.0 0.0 0.0 0.0 0.0  Total Dur (min) 0.0 0.0 0.0 0.0 0.0 0.0 0.0 0.0  % of TST 0.0 0.0 0.0 0.0 0.0 0.0 0.0 0.0  Index (#/h TST) 0.0 0.0 0.0 0.0 0.0 0.0 0.0 0.0  *Hypopneas scored based on 4% or greater desaturation.  Sleep Stage:        REM NREM TST  AHI 0.0 0.0 0.0  RDI 0.0 0.0 0.0           Body Position Data:  Sleep (min) TST (%) REM (min) NREM (min) CA (#) OA (#) MA (#) HYP (#) AHI (#/h) RERA (#) RDI (#/h) Desat (#)  Supine 52.0 15.57 0.0 52.0 0 0 0 0 0.0 0 0.00 8  Non-Supine 282.00 84.43 54.00 228.00 0.00 0.00 0.00 0.00 0.00 0 0.00 49.00  Right: 282.0 84.43 54.0 228.0 0 0 0 0 0.0 0 0.00 49  UP: 0.0 0.00 0.0 0.0 0 0 0 0 0.0 0 0.00 0     Snoring: Total number of snoring episodes  0  Total time with snoring    min (   % of sleep)   Oximetry Distribution:             WK REM NREM TOTAL  Average (%)   97 71 95 92  < 90% 0.5 14.7 8.0 23.2  < 80% 0.3 14.1 8.0 22.4  < 70% 0.3 14.1 8.0 22.4  # of Desaturations* 2 11 44 57  Desat Index (#/hour) 2.0 12.5 9.4 10.3  Desat Max (%) 4 22 12 22   Desat Max Dur (sec) 23.0 44.0 86.0 86.0  Approx Min O2 during sleep 77  Approx min O2 during a respiratory event     Was Oxygen added (Y/N) and final rate No:   0 LPM  *Desaturations based on 3% or greater drop from baseline.   Cheyne Stokes Breathing: None Present   Heart Rate Summary:  Average Heart Rate During Sleep 45.5 bpm      Highest Heart Rate During Sleep (95th %)  55.0 bpm      Highest Heart Rate During Sleep 138 bpm      Highest Heart Rate During Recording (TIB) 162 bpm (artifact)   Heart  Rate Observations: Event Type # Events   Bradycardia 0 Lowest HR Scored: N/A  Sinus Tachycardia During Sleep 0 Highest HR Scored: N/A  Narrow Complex Tachycardia 0 Highest HR Scored: N/A  Wide Complex Tachycardia 0 Highest HR Scored: N/A  Asystole 0 Longest Pause: N/A  Atrial Fibrillation 0 Duration Longest Event: N/A  Other Arrythmias  No Type:    Periodic Limb Movement Data: (Primary legs unless otherwise noted) Total # Limb Movement 0 Limb Movement Index 0.0  Total # PLMS    PLMS Index     Total # PLMS Arousals    PLMS Arousal Index     Percentage Sleep Time with PLMS   min (   % sleep)  Mean Duration limb movements (secs)

## 2021-12-02 ENCOUNTER — Ambulatory Visit (INDEPENDENT_AMBULATORY_CARE_PROVIDER_SITE_OTHER): Admitting: Nurse Practitioner

## 2021-12-02 ENCOUNTER — Encounter: Payer: Self-pay | Admitting: Nurse Practitioner

## 2021-12-02 VITALS — BP 140/90 | HR 60 | Temp 98.4°F | Resp 16 | Ht 68.0 in | Wt 182.6 lb

## 2021-12-02 DIAGNOSIS — E782 Mixed hyperlipidemia: Secondary | ICD-10-CM

## 2021-12-02 DIAGNOSIS — R300 Dysuria: Secondary | ICD-10-CM

## 2021-12-02 DIAGNOSIS — I716 Thoracoabdominal aortic aneurysm, without rupture, unspecified: Secondary | ICD-10-CM

## 2021-12-02 DIAGNOSIS — I10 Essential (primary) hypertension: Secondary | ICD-10-CM | POA: Diagnosis not present

## 2021-12-02 DIAGNOSIS — E559 Vitamin D deficiency, unspecified: Secondary | ICD-10-CM

## 2021-12-02 DIAGNOSIS — R400 Somnolence: Secondary | ICD-10-CM | POA: Diagnosis not present

## 2021-12-02 DIAGNOSIS — Z125 Encounter for screening for malignant neoplasm of prostate: Secondary | ICD-10-CM

## 2021-12-02 DIAGNOSIS — Z0001 Encounter for general adult medical examination with abnormal findings: Secondary | ICD-10-CM

## 2021-12-02 DIAGNOSIS — N529 Male erectile dysfunction, unspecified: Secondary | ICD-10-CM

## 2021-12-02 DIAGNOSIS — G4719 Other hypersomnia: Secondary | ICD-10-CM

## 2021-12-02 MED ORDER — TADALAFIL 10 MG PO TABS: 5.0000 mg | ORAL_TABLET | Freq: Every day | ORAL | 0 refills | Status: DC | PRN

## 2021-12-02 NOTE — Progress Notes (Signed)
[facility] [place], [place] [address]  Internal MEDICINE  Office Visit Note  Patient Name: Scott [name]  [id number]  [id number]  Date of Service: 12/02/2021  Chief Complaint  Patient presents with   Annual Exam    Inside of bottom lip sometimes has sore spot   Hypertension    HPI Scott [name] presents for an annual well visit and physical exam. He has a history of hypertension, difficulty sleeping, and erectile dysfunction. He is a nonsmoker, denies alcohol use and illicit drug use. He lives at home with his wife and their infant daughter. 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 He has a family history of colorectal cancer so he will be due for his colonoscopy in 2024. 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 He sees Dr. Girtha Hake who specializes in physical medicine and rehabilitation. He usually gets steroid injection in his back from her.  His referral to Dr. Girtha Hake was renewed at his last office visit.  He also has received a letter that is for his supervisor to make him exempt from certain activities during physical training with the [employer]. At his previous office visit he was prescribed tadalafil because the sildenafil was not working very well but he did not get the medication from the pharmacy so he is asking to resend it. He is due for routine labs and did not get his labs drawn last year, PSA level will be ordered again. 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  This has not been evaluated on imaging since then and the patient was unaware of the findings and diagnosis. Patient has a small sore on the inside of his mouth that resembles canker sore.    Current Medication: Outpatient Encounter Medications as of 12/02/2021  Medication Sig    amLODipine (NORVASC) 10 MG tablet Take 1 tablet (10 mg total) by mouth daily.   ibuprofen (ADVIL) 800 MG tablet Take 1 tablet (800 mg total) by mouth every 8 (eight) hours as needed for headache or moderate pain.   loratadine (CLARITIN) 10 MG tablet Take 1 tablet (10 mg total) by mouth daily.   zolpidem (AMBIEN) 5 MG tablet Take 1 tablet (5 mg total) by mouth at bedtime as needed for sleep.   [DISCONTINUED] tadalafil (CIALIS) 10 MG tablet Take 0.5-2 tablets (5-20 mg total) by mouth daily as needed for erectile dysfunction. Take 1 hour prior to activity.   tadalafil (CIALIS) 10 MG tablet Take 0.5-2 tablets (5-20 mg total) by mouth daily as needed for erectile dysfunction. Take 1 hour prior to activity.   No facility-administered encounter medications on file as of 12/02/2021.    Surgical History: Past Surgical History:  Procedure Laterality Date   ESOPHAGOGASTRODUODENOSCOPY (EGD) WITH PROPOFOL N/A 02/15/2020   Procedure: ESOPHAGOGASTRODUODENOSCOPY (EGD) WITH PROPOFOL;  Surgeon: Lucilla Lame, MD;  Location: [facility];  Service: Endoscopy;  Laterality: N/A;   EUS N/A 02/28/2020   Procedure: FULL UPPER ENDOSCOPIC ULTRASOUND (EUS) RADIAL;  Surgeon: Reita Cliche, MD;  Location: [facility];  Service: Gastroenterology;  Laterality: N/A;   FRACTURE SURGERY Right    arm    Medical History: Past Medical History:  Diagnosis Date   Hypertension     Family History: Family History  Problem Relation Age  of Onset   Cervical cancer Mother    Prostate cancer Father    Hypertension Father     Social History   Socioeconomic History   Marital status: Married    Spouse name: Not on file   Number of children: Not on file   Years of education: Not on file   Highest education level: Not on file  Occupational History   Not on file  Tobacco Use   Smoking status: Never   Smokeless tobacco: Never  Vaping Use   Vaping Use: Never used  Substance and Sexual Activity   Alcohol use:  Never   Drug use: Never   Sexual activity: Not on file  Other Topics Concern   Not on file  Social History Narrative   Not on file   Social Determinants of Health   Financial Resource Strain: Not on file  Food Insecurity: Not on file  Transportation Needs: Not on file  Physical Activity: Not on file  Stress: Not on file  Social Connections: Not on file  Intimate Partner Violence: Not on file      Review of Systems  Constitutional:  Negative for activity change, appetite change, chills, fatigue, fever and unexpected weight change.  HENT: Negative.  Negative for congestion, ear pain, rhinorrhea, sore throat and trouble swallowing.   Eyes: Negative.   Respiratory: Negative.  Negative for cough, chest tightness, shortness of breath and wheezing.   Cardiovascular: Negative.  Negative for chest pain.  Gastrointestinal: Negative.  Negative for abdominal pain, blood in stool, constipation, diarrhea, nausea and vomiting.  Endocrine: Negative.   Genitourinary: Negative.  Negative for difficulty urinating, dysuria, frequency, hematuria and urgency.  Musculoskeletal: Negative.  Negative for arthralgias, back pain, joint swelling, myalgias and neck pain.  Skin: Negative.  Negative for rash and wound.  Allergic/Immunologic: Negative.  Negative for immunocompromised state.  Neurological: Negative.  Negative for dizziness, seizures, numbness and headaches.  Hematological: Negative.   Psychiatric/Behavioral: Negative.  Negative for behavioral problems, self-injury and suicidal ideas. The patient is not nervous/anxious.     Vital Signs: BP 140/90   Pulse 60   Temp 98.4 F (36.9 C)   Resp 16   Ht 5' 8"  (1.727 m)   Wt 182 lb 9.6 oz (82.8 kg)   SpO2 97%   BMI 27.76 kg/m    Physical Exam Vitals reviewed.  Constitutional:      General: He is awake. He is not in acute distress.    Appearance: Normal appearance. He is well-developed, well-groomed and overweight. He is not ill-appearing  or diaphoretic.  HENT:     Head: Normocephalic and atraumatic.     Right Ear: Tympanic membrane, ear canal and external ear normal. There is no impacted cerumen.     Left Ear: Tympanic membrane, ear canal and external ear normal. There is no impacted cerumen.     Nose: Nose normal. No congestion or rhinorrhea.     Mouth/Throat:     Lips: Pink.     Mouth: Mucous membranes are moist.     Pharynx: Oropharynx is clear. Uvula midline. No oropharyngeal exudate or posterior oropharyngeal erythema.  Eyes:     General: Lids are normal. Vision grossly intact. Gaze aligned appropriately. No scleral icterus.       Right eye: No discharge.        Left eye: No discharge.     Extraocular Movements: Extraocular movements intact.     Conjunctiva/sclera: Conjunctivae normal.     Pupils: Pupils are  equal, round, and reactive to light.     Funduscopic exam:    Right eye: Red reflex present.        Left eye: Red reflex present. Neck:     Thyroid: No thyromegaly.     Vascular: No JVD.     Trachea: No tracheal deviation.  Cardiovascular:     Rate and Rhythm: Normal rate and regular rhythm.     Pulses: Normal pulses.     Heart sounds: Normal heart sounds, S1 normal and S2 normal. No murmur heard.    No friction rub. No gallop.  Pulmonary:     Effort: Pulmonary effort is normal. No accessory muscle usage or respiratory distress.     Breath sounds: Normal breath sounds and air entry. No stridor. No decreased breath sounds, wheezing or rales.  Chest:     Chest wall: No tenderness.  Abdominal:     General: Bowel sounds are normal. There is no distension.     Palpations: Abdomen is soft. There is no shifting dullness, fluid wave, mass or pulsatile mass.     Tenderness: There is no abdominal tenderness. There is no guarding or rebound.  Musculoskeletal:        General: No tenderness or deformity. Normal range of motion.     Cervical back: Normal range of motion and neck supple.     Right lower leg: No  edema.     Left lower leg: No edema.  Lymphadenopathy:     Cervical: No cervical adenopathy.  Skin:    General: Skin is warm and dry.     Capillary Refill: Capillary refill takes less than 2 seconds.     Coloration: Skin is not pale.     Findings: No erythema or rash.  Neurological:     Mental Status: He is alert and oriented to person, place, and time.     Cranial Nerves: No cranial nerve deficit.     Motor: No abnormal muscle tone.     Coordination: Coordination normal.     Gait: Gait normal.     Deep Tendon Reflexes: Reflexes are normal and symmetric.  Psychiatric:        Mood and Affect: Mood normal.        Behavior: Behavior normal. Behavior is cooperative.        Thought Content: Thought content normal.        Judgment: Judgment normal.        Assessment/Plan: 1. Encounter for routine adult health examination with abnormal findings Age-appropriate preventive screenings and vaccinations discussed, annual physical exam completed. Routine labs for health maintenance ordered, see below.  PHM updated.  - CMP14+EGFR - CBC with Differential/Platelet - TSH + free T4 - Vitamin D (25 hydroxy) - Lipid Profile  2. Thoracoabdominal aortic aneurysm (TAAA) without rupture, unspecified part ([place]) CTA chest ordered to reevaluate aortic aneurysm identified on prior imaging. - CT ANGIO CHEST AORTA W/CM & OR WO/CM; Future  3. Essential hypertension Stable, continue medication as prescribed, routine labs ordered - CMP14+EGFR - CBC with Differential/Platelet - TSH + free T4 - Vitamin D (25 hydroxy) - Lipid Profile  4. Daytime sleepiness Routine labs ordered, most likely related to restless leg syndrome - CMP14+EGFR - CBC with Differential/Platelet - TSH + free T4 - Vitamin D (25 hydroxy) - Lipid Profile  5. Other hypersomnia Routine labs ordered - CMP14+EGFR - CBC with Differential/Platelet - TSH + free T4 - Vitamin D (25 hydroxy) - Lipid Profile  6. Mixed  hyperlipidemia  Routine labs ordered - CMP14+EGFR - CBC with Differential/Platelet - TSH + free T4 - Vitamin D (25 hydroxy) - Lipid Profile  7. 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 Take 1 hour prior to activity.  Dispense: 10 tablet; Refill: 0   9. Dysuria Routine urinalysis done - UA/M w/rflx Culture, Routine  10. Screening for prostate cancer PSA lab ordered - PSA Total (Reflex To Free)      General Counseling: sirus labrie understanding of the findings of todays visit and agrees with plan of treatment. I have discussed any further diagnostic evaluation that may be needed or ordered today. We also reviewed his medications today. he has been encouraged to call the office with any questions or concerns that should arise related to todays visit.    Orders Placed This Encounter  Procedures   Microscopic Examination   CT ANGIO CHEST AORTA W/CM & OR WO/CM   PSA Total (Reflex To Free)   CMP14+EGFR   CBC with Differential/Platelet   TSH + free T4   Vitamin D (25 hydroxy)   Lipid Profile   UA/M w/rflx Culture, Routine    Meds ordered this encounter  Medications   tadalafil (CIALIS) 10 MG tablet    Sig: Take 0.5-2 tablets (5-20 mg total) by mouth daily as needed for erectile dysfunction. Take 1 hour prior to activity.    Dispense:  10 tablet    Refill:  0    Please fill today, discontinue sildenafil    Return in about 1 month (around 01/01/2022) for F/U CT angio chest , Herschel Fleagle PCP.   Total time spent:30 Minutes Time spent includes review of chart, medications, test results, and follow up plan with the patient.   [place] Controlled Substance Database was  reviewed by me.  This patient was seen by Jonetta Osgood, FNP-C in collaboration with Dr. Clayborn Bigness as a part of collaborative care agreement.  Jaiana Sheffer R. Valetta Fuller, MSN, FNP-C Internal medicine

## 2021-12-03 LAB — UA/M W/RFLX CULTURE, ROUTINE
Bilirubin, UA: NEGATIVE
Glucose, UA: NEGATIVE
Ketones, UA: NEGATIVE
Leukocytes,UA: NEGATIVE
Nitrite, UA: NEGATIVE
Protein,UA: NEGATIVE
RBC, UA: NEGATIVE
Specific Gravity, UA: 1.023 (ref 1.005–1.030)
Urobilinogen, Ur: 0.2 mg/dL (ref 0.2–1.0)
pH, UA: 6.5 (ref 5.0–7.5)

## 2021-12-03 LAB — MICROSCOPIC EXAMINATION
Bacteria, UA: NONE SEEN
Casts: NONE SEEN /lpf
Epithelial Cells (non renal): NONE SEEN /hpf (ref 0–10)
RBC, Urine: NONE SEEN /hpf (ref 0–2)
WBC, UA: NONE SEEN /hpf (ref 0–5)

## 2021-12-06 ENCOUNTER — Encounter: Payer: Self-pay | Admitting: Nurse Practitioner

## 2021-12-10 ENCOUNTER — Ambulatory Visit
Admission: RE | Admit: 2021-12-10 | Discharge: 2021-12-10 | Disposition: A | Discharge status: Home / Self Care | Source: Ambulatory Visit | Attending: Nurse Practitioner | Admitting: Nurse Practitioner

## 2021-12-10 DIAGNOSIS — I716 Thoracoabdominal aortic aneurysm, without rupture, unspecified: Secondary | ICD-10-CM

## 2021-12-10 MED ORDER — IOHEXOL 350 MG/ML SOLN
75.0000 mL | Freq: Once | INTRAVENOUS | Status: AC | PRN
  Administered 2021-12-10: 75 mL via INTRAVENOUS

## 2021-12-10 NOTE — Progress Notes (Signed)
Will discuss results at 12/31/21 office visit

## 2021-12-11 ENCOUNTER — Telehealth: Payer: Self-pay

## 2021-12-11 DIAGNOSIS — N529 Male erectile dysfunction, unspecified: Secondary | ICD-10-CM

## 2021-12-11 LAB — CMP14+EGFR
ALT: 29 IU/L (ref 0–44)
AST: 31 IU/L (ref 0–40)
Albumin/Globulin Ratio: 2.2 (ref 1.2–2.2)
Albumin: 5 g/dL — ABNORMAL HIGH (ref 3.8–4.9)
Alkaline Phosphatase: 51 IU/L (ref 44–121)
BUN/Creatinine Ratio: 18 (ref 9–20)
BUN: 16 mg/dL (ref 6–24)
Bilirubin Total: 0.6 mg/dL (ref 0.0–1.2)
CO2: 22 mmol/L (ref 20–29)
Calcium: 9.6 mg/dL (ref 8.7–10.2)
Chloride: 104 mmol/L (ref 96–106)
Creatinine, Ser: 0.9 mg/dL (ref 0.76–1.27)
Globulin, Total: 2.3 g/dL (ref 1.5–4.5)
Glucose: 91 mg/dL (ref 70–99)
Potassium: 4.3 mmol/L (ref 3.5–5.2)
Sodium: 141 mmol/L (ref 134–144)
Total Protein: 7.3 g/dL (ref 6.0–8.5)
eGFR: 101 mL/min/{1.73_m2} (ref >59)

## 2021-12-11 LAB — LIPID PANEL
Chol/HDL Ratio: 3.8 ratio (ref 0.0–5.0)
Cholesterol, Total: 192 mg/dL (ref 100–199)
HDL: 50 mg/dL (ref >39)
LDL Chol Calc (NIH): 133 mg/dL — ABNORMAL HIGH (ref 0–99)
Triglycerides: 48 mg/dL (ref 0–149)
VLDL Cholesterol Cal: 9 mg/dL (ref 5–40)

## 2021-12-11 LAB — CBC WITH DIFFERENTIAL/PLATELET
Basophils Absolute: 0 10*3/uL (ref 0.0–0.2)
Basos: 1 %
EOS (ABSOLUTE): 0.1 10*3/uL (ref 0.0–0.4)
Eos: 1 %
Hematocrit: 38.1 % (ref 37.5–51.0)
Hemoglobin: 12 g/dL — ABNORMAL LOW (ref 13.0–17.7)
Immature Grans (Abs): 0 10*3/uL (ref 0.0–0.1)
Immature Granulocytes: 0 %
Lymphocytes Absolute: 2.2 10*3/uL (ref 0.7–3.1)
Lymphs: 44 %
MCH: 26.7 pg (ref 26.6–33.0)
MCHC: 31.5 g/dL (ref 31.5–35.7)
MCV: 85 fL (ref 79–97)
Monocytes Absolute: 0.3 10*3/uL (ref 0.1–0.9)
Monocytes: 6 %
Neutrophils Absolute: 2.4 10*3/uL (ref 1.4–7.0)
Neutrophils: 48 %
Platelets: 226 10*3/uL (ref 150–450)
RBC: 4.49 x10E6/uL (ref 4.14–5.80)
RDW: 12.4 % (ref 11.6–15.4)
WBC: 5 10*3/uL (ref 3.4–10.8)

## 2021-12-11 LAB — TSH+FREE T4
Free T4: 1.25 ng/dL (ref 0.82–1.77)
TSH: 0.746 u[IU]/mL (ref 0.450–4.500)

## 2021-12-11 LAB — PSA TOTAL (REFLEX TO FREE): Prostate Specific Ag, Serum: 2.5 ng/mL (ref 0.0–4.0)

## 2021-12-11 LAB — VITAMIN D 25 HYDROXY (VIT D DEFICIENCY, FRACTURES): Vit D, 25-Hydroxy: 22.5 ng/mL — ABNORMAL LOW (ref 30.0–100.0)

## 2021-12-11 MED ORDER — TADALAFIL 10 MG PO TABS: 5.0000 mg | ORAL_TABLET | Freq: Every day | ORAL | 3 refills | Status: DC | PRN

## 2021-12-11 NOTE — Telephone Encounter (Signed)
PA for TADALAFIL 10 mg sent and came back approved valid 11/07/21 to 06/27/2098

## 2021-12-31 ENCOUNTER — Ambulatory Visit (INDEPENDENT_AMBULATORY_CARE_PROVIDER_SITE_OTHER): Admitting: Nurse Practitioner

## 2021-12-31 ENCOUNTER — Encounter: Payer: Self-pay | Admitting: Nurse Practitioner

## 2021-12-31 VITALS — BP 116/81 | HR 62 | Temp 98.8°F | Resp 16 | Ht 68.0 in | Wt 178.0 lb

## 2021-12-31 DIAGNOSIS — F16283 Hallucinogen dependence with hallucinogen persisting perception disorder (flashbacks): Secondary | ICD-10-CM

## 2021-12-31 DIAGNOSIS — M25551 Pain in right hip: Secondary | ICD-10-CM

## 2021-12-31 DIAGNOSIS — I716 Thoracoabdominal aortic aneurysm, without rupture, unspecified: Secondary | ICD-10-CM | POA: Diagnosis not present

## 2021-12-31 DIAGNOSIS — F515 Nightmare disorder: Secondary | ICD-10-CM | POA: Diagnosis not present

## 2021-12-31 DIAGNOSIS — G8929 Other chronic pain: Secondary | ICD-10-CM

## 2021-12-31 MED ORDER — PREDNISONE 10 MG (21) PO TBPK: ORAL_TABLET | ORAL | 0 refills | Status: DC

## 2021-12-31 NOTE — Progress Notes (Signed)
[facility] [address] [place], [place] [address]  Internal MEDICINE  Office Visit Note  Patient Name: Scott [name]  [id number]  [id number]  Date of Service: 12/31/2021  Chief Complaint  Patient presents with   Follow-up   Results   Hypertension    HPI Olufemi presents for follow-up visit to discuss the results of his CTA chest and is also experiencing right hip pain and left knee pain.  -Takes amlodipine 10 mg daily for high blood pressure and his blood pressure is well controlled today --CTA chest results shows the maximum diameter of the ascending thoracic aorta dilatation is 4 cm, the radiologist does recommend annual imaging surveillance with CTA or MRA. There is also a splenic cyst with increasing calcifications in the periphery and inferior aspect, regarded as nonspecific and etiologies are typically benign including postinfectious or inflammatory lesion but the radiologist did note that the increased size of the splenic cyst was unusual.  If there is any further concern, the radiologist recommended further assessment with abdominal MRI. CTA results Chronic right hip pain, exempt from PT duties with [employer] per Dr.'s note.  Is also seeing Dr. Filomena Jungling for this problem. Left knee pain most likely inflammatory or arthritic.  No injury no outward sign of infection or swelling.  Could be overuse --Patient is also experiencing flashbacks and nightmares related to his past/work and would like to see a therapist to have someone professional to discuss this with   Current Medication: Outpatient Encounter Medications as of 12/31/2021  Medication Sig   amLODipine (NORVASC) 10 MG tablet Take 1 tablet (10 mg total) by mouth daily.   ibuprofen (ADVIL) 800 MG tablet Take 1 tablet (800 mg total) by mouth every 8 (eight) hours as needed for headache or moderate pain.   loratadine (CLARITIN) 10 MG tablet Take 1 tablet (10 mg total) by mouth daily.   predniSONE (STERAPRED  UNI-PAK 21 TAB) 10 MG (21) TBPK tablet Use as directed for 6 days   tadalafil (CIALIS) 10 MG tablet Take 0.5-2 tablets (5-20 mg total) by mouth daily as needed for erectile dysfunction. Take 1 hour prior to activity.   zolpidem (AMBIEN) 5 MG tablet Take 1 tablet (5 mg total) by mouth at bedtime as needed for sleep.   No facility-administered encounter medications on file as of 12/31/2021.    Surgical History: Past Surgical History:  Procedure Laterality Date   ESOPHAGOGASTRODUODENOSCOPY (EGD) WITH PROPOFOL N/A 02/15/2020   Procedure: ESOPHAGOGASTRODUODENOSCOPY (EGD) WITH PROPOFOL;  Surgeon: Midge Minium, MD;  Location: [facility];  Service: Endoscopy;  Laterality: N/A;   EUS N/A 02/28/2020   Procedure: FULL UPPER ENDOSCOPIC ULTRASOUND (EUS) RADIAL;  Surgeon: Doren Custard, MD;  Location: [facility];  Service: Gastroenterology;  Laterality: N/A;   FRACTURE SURGERY Right    arm    Medical History: Past Medical History:  Diagnosis Date   Hypertension     Family History: Family History  Problem Relation Age of Onset   Cervical cancer Mother    Prostate cancer Father    Hypertension Father     Social History   Socioeconomic History   Marital status: Married    Spouse name: Not on file   Number of children: Not on file   Years of education: Not on file   Highest education level: Not on file  Occupational History   Not on file  Tobacco Use   Smoking status: Never   Smokeless tobacco: Never  Vaping Use   Vaping  Use: Never used  Substance and Sexual Activity   Alcohol use: Never   Drug use: Never   Sexual activity: Not on file  Other Topics Concern   Not on file  Social History Narrative   Not on file   Social Determinants of Health   Financial Resource Strain: Not on file  Food Insecurity: Not on file  Transportation Needs: Not on file  Physical Activity: Not on file  Stress: Not on file  Social Connections: Not on file  Intimate Partner Violence:  Not on file      Review of Systems  Constitutional:  Negative for chills, fatigue and unexpected weight change.  HENT:  Negative for congestion, rhinorrhea, sneezing and sore throat.   Eyes:  Negative for redness.  Respiratory:  Negative for cough, chest tightness and shortness of breath.   Cardiovascular:  Negative for chest pain and palpitations.  Gastrointestinal:  Negative for abdominal pain, constipation, diarrhea, nausea and vomiting.  Genitourinary:  Negative for dysuria and frequency.  Musculoskeletal:  Positive for arthralgias and back pain. Negative for joint swelling and neck pain.  Skin:  Negative for rash.  Neurological: Negative.  Negative for tremors and numbness.  Hematological:  Negative for adenopathy. Does not bruise/bleed easily.  Psychiatric/Behavioral:  Positive for sleep disturbance. Negative for behavioral problems (Depression), self-injury and suicidal ideas. The patient is not nervous/anxious.     Vital Signs: BP 116/81   Pulse 62   Temp 98.8 F (37.1 C)   Resp 16   Ht 5\' 8"  (1.727 m)   Wt 178 lb (80.7 kg)   SpO2 98%   BMI 27.06 kg/m    Physical Exam Vitals reviewed.  Constitutional:      Appearance: Normal appearance. He is normal weight.  HENT:     Head: Normocephalic and atraumatic.  Eyes:     Pupils: Pupils are equal, round, and reactive to light.  Cardiovascular:     Rate and Rhythm: Normal rate and regular rhythm.  Pulmonary:     Effort: Pulmonary effort is normal. No respiratory distress.  Musculoskeletal:        General: Normal range of motion.  Neurological:     Mental Status: He is alert and oriented to person, place, and time.     Cranial Nerves: No cranial nerve deficit.     Coordination: Coordination normal.     Gait: Gait normal.  Psychiatric:        Mood and Affect: Mood normal.        Behavior: Behavior normal.        Assessment/Plan: 1. Thoracoabdominal aortic aneurysm (TAAA) without rupture, unspecified part  ([facility]) Follow up yearly for repeat CTA or MRA, stable at this time. 4 cm in diameter  2. Chronic right hip pain Prednisone taper prescribed to help decrease pain between cortisone injections, this will decrease pain and inflammation in the right hip and the left knee - predniSONE (STERAPRED UNI-PAK 21 TAB) 10 MG (21) TBPK tablet; Use as directed for 6 days  Dispense: 21 tablet; Refill: 0  3. Nightmares Refer to psychology, patient would like to see a therapist.  - Ambulatory referral to Psychology  4. Flashbacks [facility]) Refer to psychology - Ambulatory referral to Psychology   General Counseling: arian murley understanding of the findings of todays visit and agrees with plan of treatment. I have discussed any further diagnostic evaluation that may be needed or ordered today. We also reviewed his medications today. he has been encouraged to call the  office with any questions or concerns that should arise related to todays visit.    Orders Placed This Encounter  Procedures   Ambulatory referral to Psychology    Meds ordered this encounter  Medications   predniSONE (STERAPRED UNI-PAK 21 TAB) 10 MG (21) TBPK tablet    Sig: Use as directed for 6 days    Dispense:  21 tablet    Refill:  0    Return in about 3 months (around 04/02/2022) for F/U, Keandria Berrocal PCP.   Total time spent:30 Minutes Time spent includes review of chart, medications, test results, and follow up plan with the patient.   [place] Controlled Substance Database was reviewed by me.  This patient was seen by Sallyanne Kuster, FNP-C in collaboration with Dr. Beverely Risen as a part of collaborative care agreement.   Miloh Alcocer R. Tedd Sias, MSN, FNP-C Internal medicine

## 2022-01-01 ENCOUNTER — Telehealth: Payer: Self-pay

## 2022-01-01 NOTE — Telephone Encounter (Signed)
Awaiting 12/31/21 office notes for [facility] referra & [employer] authorizationl-[place]

## 2022-01-01 NOTE — Telephone Encounter (Signed)
Spoke with patient. He will call [employer] to have them change pcp to [facility]

## 2022-01-05 ENCOUNTER — Telehealth: Payer: Self-pay

## 2022-01-05 NOTE — Telephone Encounter (Signed)
Received [employer] referral approval for [facility]-[place]

## 2022-01-13 ENCOUNTER — Telehealth: Payer: Self-pay

## 2022-01-13 NOTE — Telephone Encounter (Signed)
[facility] referral emailed to [employer] in [place]

## 2022-02-14 ENCOUNTER — Encounter: Payer: Self-pay | Admitting: Nurse Practitioner

## 2022-04-01 ENCOUNTER — Encounter: Payer: Self-pay | Admitting: Nurse Practitioner

## 2022-04-01 ENCOUNTER — Ambulatory Visit (INDEPENDENT_AMBULATORY_CARE_PROVIDER_SITE_OTHER): Admitting: Nurse Practitioner

## 2022-04-01 VITALS — BP 115/79 | HR 61 | Temp 98.2°F | Resp 16 | Ht 68.0 in | Wt 178.2 lb

## 2022-04-01 DIAGNOSIS — I10 Essential (primary) hypertension: Secondary | ICD-10-CM | POA: Diagnosis not present

## 2022-04-01 DIAGNOSIS — G479 Sleep disorder, unspecified: Secondary | ICD-10-CM | POA: Diagnosis not present

## 2022-04-01 DIAGNOSIS — Z23 Encounter for immunization: Secondary | ICD-10-CM | POA: Diagnosis not present

## 2022-04-01 DIAGNOSIS — N529 Male erectile dysfunction, unspecified: Secondary | ICD-10-CM | POA: Diagnosis not present

## 2022-04-01 MED ORDER — AMLODIPINE BESYLATE 10 MG PO TABS: 10.0000 mg | ORAL_TABLET | Freq: Every day | ORAL | 1 refills | Status: DC

## 2022-04-01 MED ORDER — TADALAFIL 10 MG PO TABS: 5.0000 mg | ORAL_TABLET | Freq: Every day | ORAL | 3 refills | Status: AC | PRN

## 2022-04-01 MED ORDER — ZOLPIDEM TARTRATE 5 MG PO TABS: 5.0000 mg | ORAL_TABLET | Freq: Every evening | ORAL | 1 refills | Status: AC | PRN

## 2022-04-01 NOTE — Progress Notes (Signed)
[facility] [address] [place], [place] [address]  Internal MEDICINE  Office Visit Note  Patient Name: Scott [name]  [id number]  [id number]  Date of Service: 04/01/2022  Chief Complaint  Patient presents with   Follow-up   Hypertension    HPI Scott [name] presents for a follow up visit for hypertension, insomnia and chronic back pain.  --blood pressure remains well controlled  --Scott [name] is effective in helping him sleep.  --tadalafil refills.  --requesting flu shot.     Current Medication: Outpatient Encounter Medications as of 04/01/2022  Medication Sig   ibuprofen (ADVIL) 800 MG tablet Take 1 tablet (800 mg total) by mouth every 8 (eight) hours as needed for headache or moderate pain.   [DISCONTINUED] amLODipine (NORVASC) 10 MG tablet Take 1 tablet (10 mg total) by mouth daily.   [DISCONTINUED] loratadine (CLARITIN) 10 MG tablet Take 1 tablet (10 mg total) by mouth daily.   [DISCONTINUED] predniSONE (STERAPRED UNI-PAK 21 TAB) 10 MG (21) TBPK tablet Use as directed for 6 days   [DISCONTINUED] tadalafil (CIALIS) 10 MG tablet Take 0.5-2 tablets (5-20 mg total) by mouth daily as needed for erectile dysfunction. Take 1 hour prior to activity.   [DISCONTINUED] zolpidem (AMBIEN) 5 MG tablet Take 1 tablet (5 mg total) by mouth at bedtime as needed for sleep.   amLODipine (NORVASC) 10 MG tablet Take 1 tablet (10 mg total) by mouth daily.   tadalafil (CIALIS) 10 MG tablet Take 0.5-2 tablets (5-20 mg total) by mouth daily as needed for erectile dysfunction. Take 1 hour prior to activity.   zolpidem (AMBIEN) 5 MG tablet Take 1 tablet (5 mg total) by mouth at bedtime as needed for sleep.   No facility-administered encounter medications on file as of 04/01/2022.    Surgical History: Past Surgical History:  Procedure Laterality Date   ESOPHAGOGASTRODUODENOSCOPY (EGD) WITH PROPOFOL N/A 02/15/2020   Procedure: ESOPHAGOGASTRODUODENOSCOPY (EGD) WITH PROPOFOL;  Surgeon: Midge Minium, MD;  Location: [facility];  Service: Endoscopy;  Laterality: N/A;   EUS N/A 02/28/2020   Procedure: FULL UPPER ENDOSCOPIC ULTRASOUND (EUS) RADIAL;  Surgeon: Doren Custard, MD;  Location: [facility] ENDOSCOPY;  Service: Gastroenterology;  Laterality: N/A;   FRACTURE SURGERY Right    arm    Medical History: Past Medical History:  Diagnosis Date   Hypertension     Family History: Family History  Problem Relation Age of Onset   Cervical cancer Mother    Prostate cancer Father    Hypertension Father     Social History   Socioeconomic History   Marital status: Married    Spouse name: Not on file   Number of children: Not on file   Years of education: Not on file   Highest education level: Not on file  Occupational History   Not on file  Tobacco Use   Smoking status: Never   Smokeless tobacco: Never  Vaping Use   Vaping Use: Never used  Substance and Sexual Activity   Alcohol use: Never   Drug use: Never   Sexual activity: Not on file  Other Topics Concern   Not on file  Social History Narrative   Not on file   Social Determinants of Health   Financial Resource Strain: Not on file  Food Insecurity: Not on file  Transportation Needs: Not on file  Physical Activity: Not on file  Stress: Not on file  Social Connections: Not on file  Intimate Partner Violence: Not on file  Review of Systems  Constitutional:  Negative for chills, fatigue and unexpected weight change.  HENT:  Negative for congestion, rhinorrhea, sneezing and sore throat.   Eyes:  Negative for redness.  Respiratory:  Negative for cough, chest tightness and shortness of breath.   Cardiovascular:  Negative for chest pain and palpitations.  Gastrointestinal:  Negative for abdominal pain, constipation, diarrhea, nausea and vomiting.  Genitourinary:  Negative for dysuria and frequency.  Musculoskeletal:  Positive for arthralgias and back pain. Negative for joint swelling and neck pain.   Skin:  Negative for rash.  Neurological: Negative.  Negative for tremors and numbness.  Hematological:  Negative for adenopathy. Does not bruise/bleed easily.  Psychiatric/Behavioral:  Positive for sleep disturbance. Negative for behavioral problems (Depression), self-injury and suicidal ideas. The patient is not nervous/anxious.     Vital Signs: BP 115/79   Pulse 61   Temp 98.2 F (36.8 C)   Resp 16   Ht 5\' 8"  (1.727 m)   Wt 178 lb 3.2 oz (80.8 kg)   SpO2 98%   BMI 27.10 kg/m    Physical Exam Vitals reviewed.  Constitutional:      Appearance: Normal appearance. He is normal weight.  HENT:     Head: Normocephalic and atraumatic.  Eyes:     Pupils: Pupils are equal, round, and reactive to light.  Cardiovascular:     Rate and Rhythm: Normal rate and regular rhythm.  Pulmonary:     Effort: Pulmonary effort is normal. No respiratory distress.  Musculoskeletal:        General: Normal range of motion.  Neurological:     Mental Status: He is alert and oriented to person, place, and time.     Cranial Nerves: No cranial nerve deficit.     Coordination: Coordination normal.     Gait: Gait normal.  Psychiatric:        Mood and Affect: Mood normal.        Behavior: Behavior normal.        Assessment/Plan: 1. Essential hypertension Stable, continue amlodipine as prescribed.  - amLODipine (NORVASC) 10 MG tablet; Take 1 tablet (10 mg total) by mouth daily.  Dispense: 90 tablet; Refill: 1  2. Sleep disturbance Ambien prescribed, take prn as prescribed.  - zolpidem (AMBIEN) 5 MG tablet; Take 1 tablet (5 mg total) by mouth at bedtime as needed for sleep.  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 Take 1 hour prior to activity.  Dispense: 10 tablet; Refill: 3  4. Needs flu  shot Administered in office today - Flu Vaccine MDCK QUAD PF   General Counseling: Scott [name] verbalizes understanding of the findings of todays visit and agrees with plan of treatment. I have discussed any further diagnostic evaluation that may be needed or ordered today. We also reviewed his medications today. he has been encouraged to call the office with any questions or concerns that should arise related to todays visit.    Orders Placed This Encounter  Procedures   Flu Vaccine MDCK QUAD PF    Meds ordered this encounter  Medications   zolpidem (AMBIEN) 5 MG tablet    Sig: Take 1 tablet (5 mg total) by mouth at bedtime as needed for sleep.    Dispense:  30 tablet    Refill:  1  amLODipine (NORVASC) 10 MG tablet    Sig: Take 1 tablet (10 mg total) by mouth daily.    Dispense:  90 tablet    Refill:  1   tadalafil (CIALIS) 10 MG tablet    Sig: Take 0.5-2 tablets (5-20 mg total) by mouth daily as needed for erectile dysfunction. Take 1 hour prior to activity.    Dispense:  10 tablet    Refill:  3    PA for TADALAFIL 10 mg sent and came back approved valid 11/07/21 to 06/27/2098    Return in about 2 months (around 06/01/2022) for F/U, Marcayla Budge PCP.   Total time spent:30 Minutes Time spent includes review of chart, medications, test results, and follow up plan with the patient.   [place] Controlled Substance Database was reviewed by me.  This patient was seen by Sallyanne Kuster, FNP-C in collaboration with Dr. Beverely Risen as a part of collaborative care agreement.   Sorren Vallier R. Tedd Sias, MSN, FNP-C Internal medicine

## 2022-05-09 ENCOUNTER — Encounter: Payer: Self-pay | Admitting: Nurse Practitioner

## 2022-05-25 ENCOUNTER — Telehealth: Payer: Self-pay | Admitting: Nurse Practitioner

## 2022-05-25 NOTE — Telephone Encounter (Signed)
Emailed completed medical form to patient-Toni

## 2022-10-13 ENCOUNTER — Other Ambulatory Visit: Payer: Self-pay | Admitting: Nurse Practitioner

## 2022-10-13 ENCOUNTER — Telehealth: Payer: Self-pay | Admitting: Nurse Practitioner

## 2022-10-13 DIAGNOSIS — I10 Essential (primary) hypertension: Secondary | ICD-10-CM

## 2022-10-13 DIAGNOSIS — M48061 Spinal stenosis, lumbar region without neurogenic claudication: Secondary | ICD-10-CM

## 2022-10-13 MED ORDER — AMLODIPINE BESYLATE 10 MG PO TABS: 10.0000 mg | ORAL_TABLET | Freq: Every day | ORAL | 0 refills | Status: AC

## 2022-10-13 MED ORDER — IBUPROFEN 800 MG PO TABS: 800.0000 mg | ORAL_TABLET | Freq: Three times a day (TID) | ORAL | 0 refills | Status: AC | PRN

## 2022-10-14 NOTE — Telephone Encounter (Signed)
Done by Arlyss Repress

## 2022-12-09 ENCOUNTER — Encounter: Admitting: Nurse Practitioner

## 2023-01-13 ENCOUNTER — Other Ambulatory Visit: Payer: Self-pay | Admitting: Nurse Practitioner

## 2023-01-13 DIAGNOSIS — I10 Essential (primary) hypertension: Secondary | ICD-10-CM
